# Patient Record
Sex: Male | Born: 1990 | Race: Black or African American | Hispanic: No | Marital: Single | Smoking: Former smoker
Health system: Southern US, Community
[De-identification: ages and names within clinical notes are randomized; demographics above are authoritative.]

## PROBLEM LIST (undated history)

## (undated) DIAGNOSIS — N4400 Torsion of testis, unspecified: Secondary | ICD-10-CM

## (undated) DIAGNOSIS — F319 Bipolar disorder, unspecified: Secondary | ICD-10-CM

## (undated) DIAGNOSIS — F411 Generalized anxiety disorder: Secondary | ICD-10-CM

## (undated) HISTORY — DX: Generalized anxiety disorder: F41.1

## (undated) HISTORY — DX: Bipolar disorder, unspecified: F31.9

---

## 2009-03-03 ENCOUNTER — Emergency Department: Payer: Self-pay | Admitting: Emergency Medicine

## 2010-01-22 ENCOUNTER — Ambulatory Visit: Payer: Self-pay | Admitting: Internal Medicine

## 2010-08-22 ENCOUNTER — Emergency Department: Payer: Self-pay | Admitting: Emergency Medicine

## 2012-05-09 IMAGING — US US PELVIS LIMITED
1 series · 17 of 25 positions shown · non-contrast
Comparison: none

REASON FOR EXAM: LEFT TESTICLE PAIN; H/O EPIDIDYMITIS
COMMENTS:   May transport without cardiac monitor

PROCEDURE:     US  - US TESTICULAR  - August 23, 2010  [DATE]
RESULT:

[Series 1: us pelvis limited · 50 acquisitions, 17 frames shown]
[im 1/50]
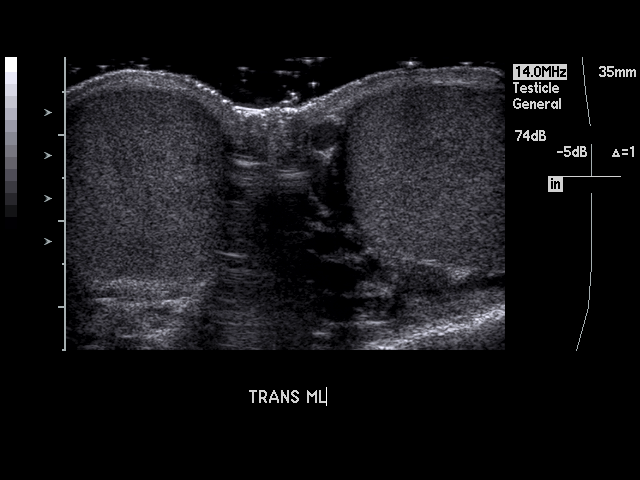
[im 5/50]
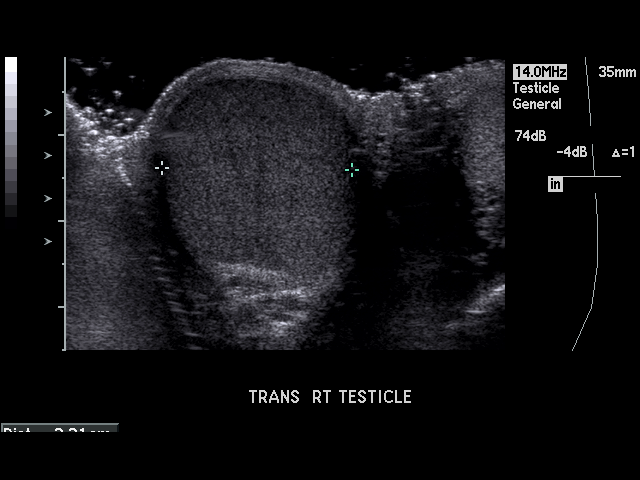
[im 7/50]
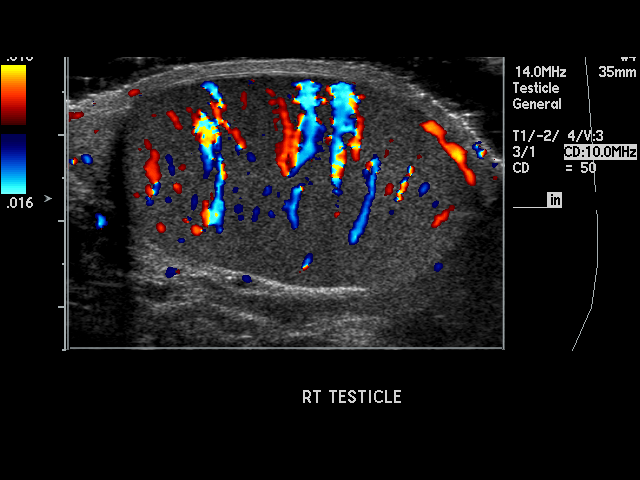
[im 11/50]
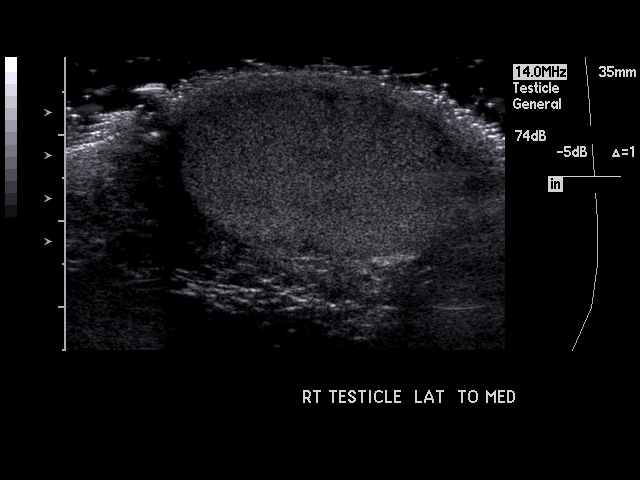
[im 13/50]
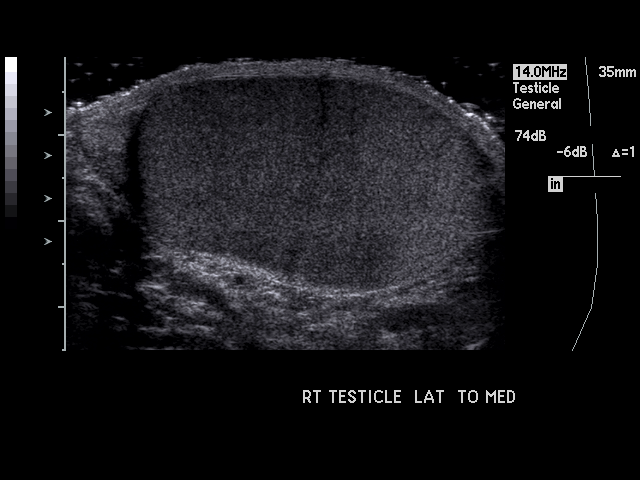
[im 17/50]
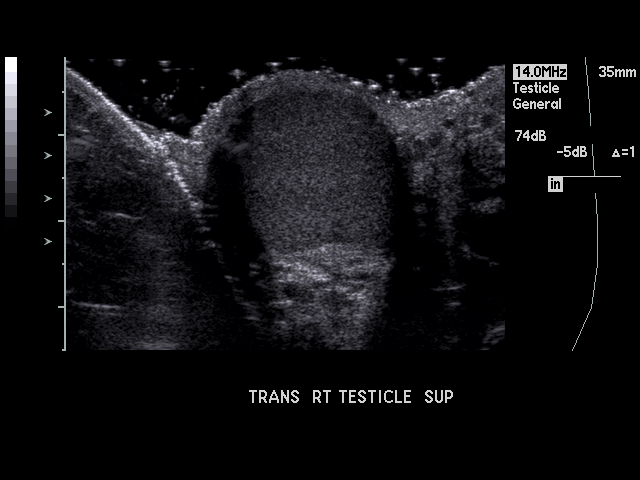
[im 19/50]
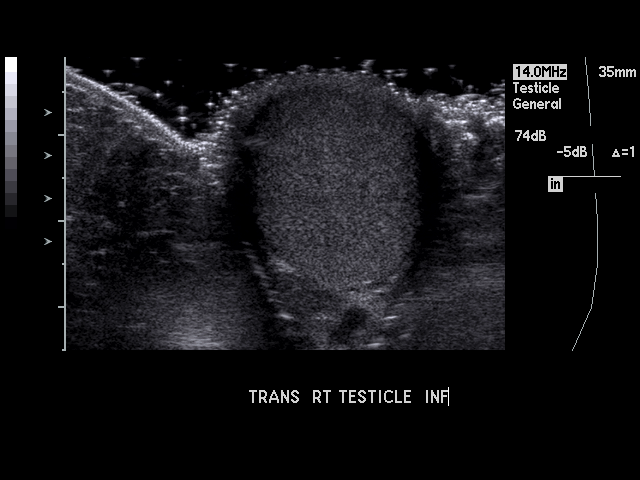
[im 23/50]
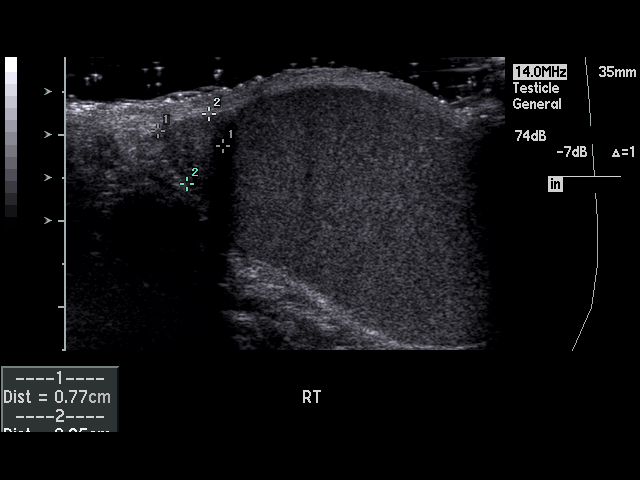
[im 25/50]
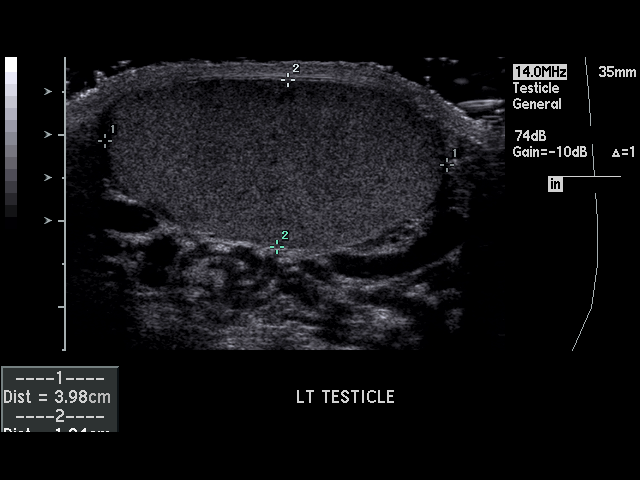
[im 27/50]
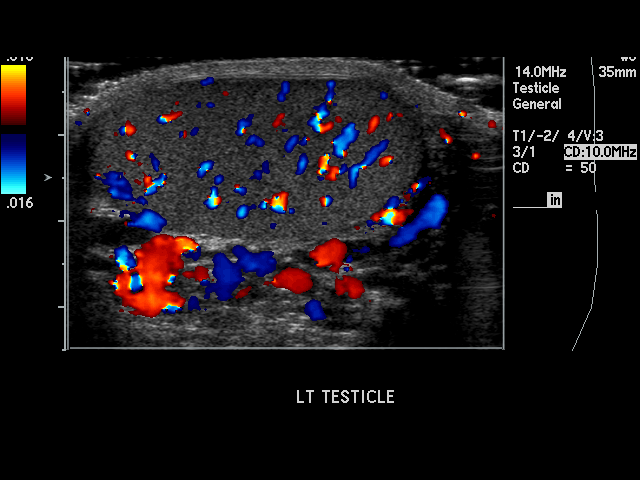
[im 31/50]
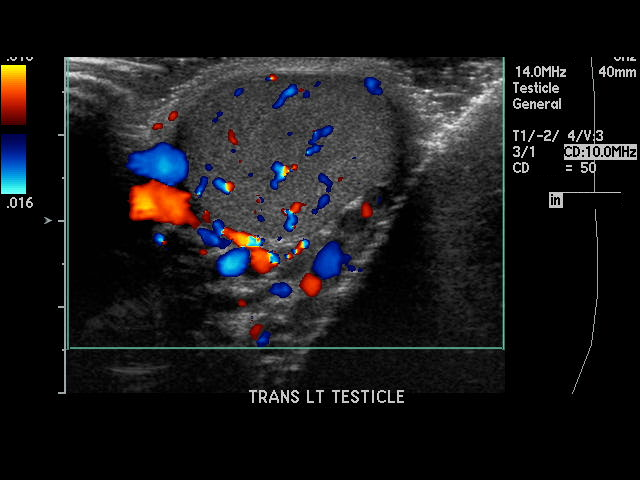
[im 33/50]
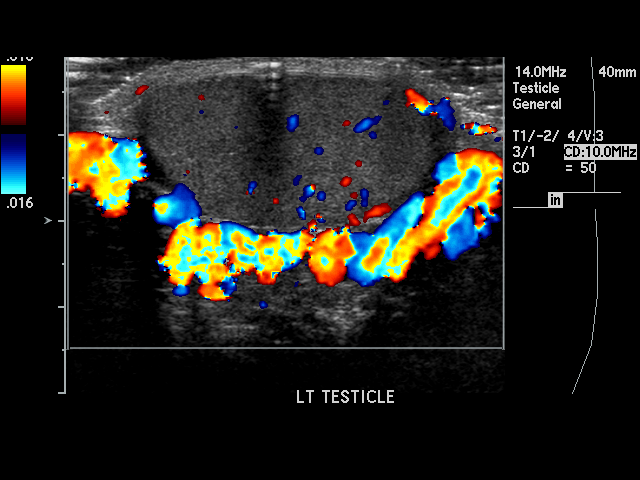
[im 37/50]
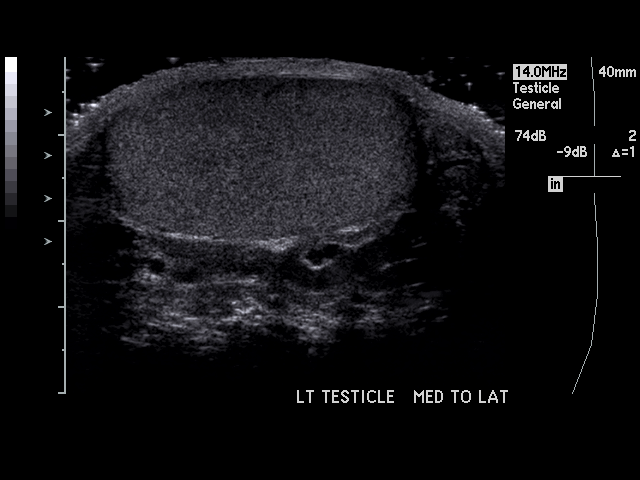
[im 39/50]
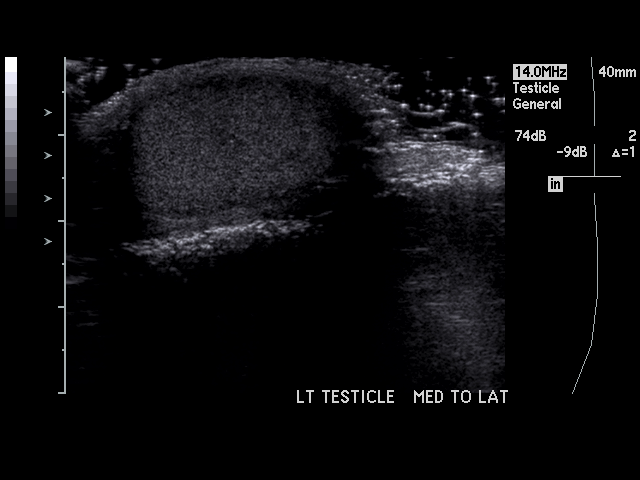
[im 43/50]
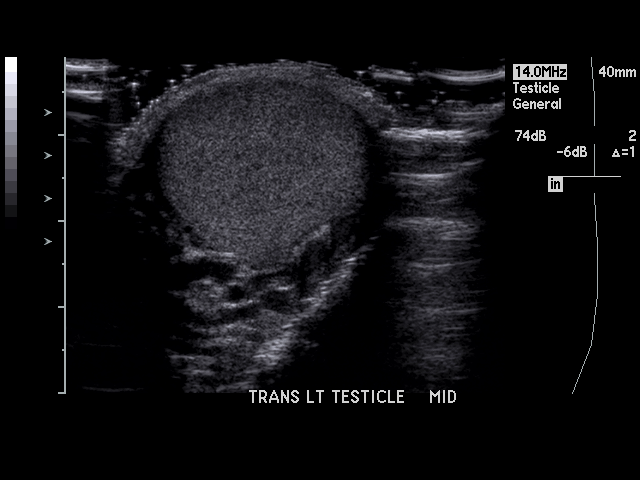
[im 45/50]
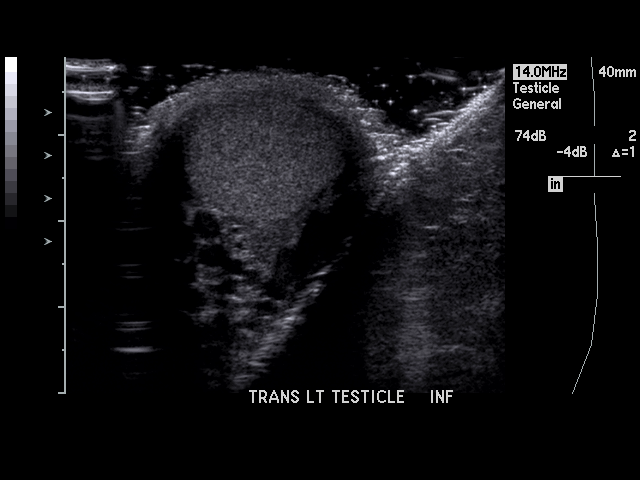
[im 50/50]
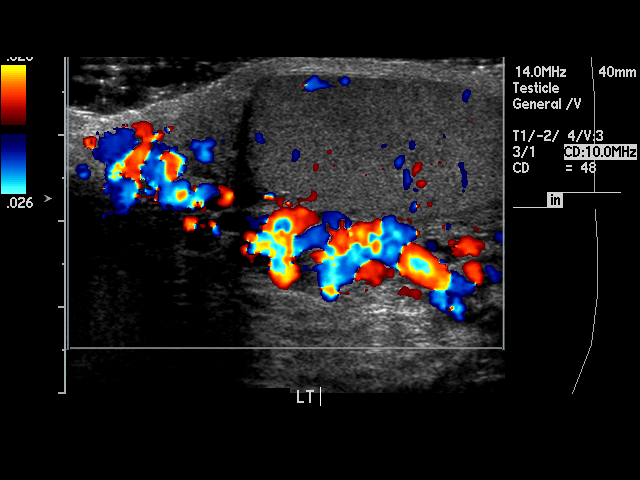

[17 of 25 positions shown; findings below may reference images not displayed]

FINDINGS: The right testicle measures 4.2 x 2.21 x 2.29 cm and the left
x 2.65 x 1.94 cm. The testicular architecture demonstrates a homogeneous
gray felt appearance. Symmetric arterial venous flow is identified within
the right and left testicles. The epididymides are symmetric with symmetric
homogeneous echotexture and symmetric flow. The right measures 0.77 x 1.38 x
0.85 cm and the left 0.72 x 0.85 x 0.94 cm. A moderate to large varicocele
is appreciated within the left hemiscrotum.

No further sonographic abnormalities are identified.
IMPRESSION: 1.  Stable Scrotal Ultrasound when compared to previous study dated 01/22/2010.
[DATE].  Findings again consistent with a varicocele.
3.  Dr. Bench of the Emergency Department was informed of these findings via
a preliminary faxed report.

## 2014-01-25 ENCOUNTER — Ambulatory Visit (INDEPENDENT_AMBULATORY_CARE_PROVIDER_SITE_OTHER): Payer: 59 | Admitting: Licensed Clinical Social Worker

## 2014-01-25 DIAGNOSIS — F4323 Adjustment disorder with mixed anxiety and depressed mood: Secondary | ICD-10-CM

## 2014-02-01 ENCOUNTER — Ambulatory Visit (INDEPENDENT_AMBULATORY_CARE_PROVIDER_SITE_OTHER): Payer: 59 | Admitting: Licensed Clinical Social Worker

## 2014-02-01 DIAGNOSIS — F4323 Adjustment disorder with mixed anxiety and depressed mood: Secondary | ICD-10-CM

## 2014-02-11 ENCOUNTER — Ambulatory Visit (INDEPENDENT_AMBULATORY_CARE_PROVIDER_SITE_OTHER): Payer: 59 | Admitting: Licensed Clinical Social Worker

## 2014-02-11 DIAGNOSIS — F4323 Adjustment disorder with mixed anxiety and depressed mood: Secondary | ICD-10-CM

## 2014-02-15 ENCOUNTER — Ambulatory Visit (INDEPENDENT_AMBULATORY_CARE_PROVIDER_SITE_OTHER): Payer: 59 | Admitting: Licensed Clinical Social Worker

## 2014-02-15 DIAGNOSIS — F331 Major depressive disorder, recurrent, moderate: Secondary | ICD-10-CM

## 2014-02-15 DIAGNOSIS — F411 Generalized anxiety disorder: Secondary | ICD-10-CM

## 2014-02-25 ENCOUNTER — Ambulatory Visit (INDEPENDENT_AMBULATORY_CARE_PROVIDER_SITE_OTHER): Payer: 59 | Admitting: Licensed Clinical Social Worker

## 2014-02-25 DIAGNOSIS — F332 Major depressive disorder, recurrent severe without psychotic features: Secondary | ICD-10-CM

## 2014-02-25 DIAGNOSIS — F411 Generalized anxiety disorder: Secondary | ICD-10-CM

## 2014-03-04 ENCOUNTER — Ambulatory Visit (INDEPENDENT_AMBULATORY_CARE_PROVIDER_SITE_OTHER): Payer: 59 | Admitting: Licensed Clinical Social Worker

## 2014-03-04 DIAGNOSIS — F411 Generalized anxiety disorder: Secondary | ICD-10-CM

## 2014-03-04 DIAGNOSIS — F332 Major depressive disorder, recurrent severe without psychotic features: Secondary | ICD-10-CM

## 2014-03-15 ENCOUNTER — Ambulatory Visit (INDEPENDENT_AMBULATORY_CARE_PROVIDER_SITE_OTHER): Payer: 59 | Admitting: Licensed Clinical Social Worker

## 2014-03-15 DIAGNOSIS — F332 Major depressive disorder, recurrent severe without psychotic features: Secondary | ICD-10-CM

## 2014-03-15 DIAGNOSIS — F411 Generalized anxiety disorder: Secondary | ICD-10-CM

## 2014-03-25 ENCOUNTER — Ambulatory Visit (INDEPENDENT_AMBULATORY_CARE_PROVIDER_SITE_OTHER): Payer: 59 | Admitting: Licensed Clinical Social Worker

## 2014-03-25 DIAGNOSIS — F411 Generalized anxiety disorder: Secondary | ICD-10-CM

## 2014-03-25 DIAGNOSIS — F332 Major depressive disorder, recurrent severe without psychotic features: Secondary | ICD-10-CM

## 2014-04-01 ENCOUNTER — Ambulatory Visit (INDEPENDENT_AMBULATORY_CARE_PROVIDER_SITE_OTHER): Payer: 59 | Admitting: Licensed Clinical Social Worker

## 2014-04-01 DIAGNOSIS — F332 Major depressive disorder, recurrent severe without psychotic features: Secondary | ICD-10-CM

## 2014-04-01 DIAGNOSIS — F411 Generalized anxiety disorder: Secondary | ICD-10-CM

## 2014-04-08 ENCOUNTER — Ambulatory Visit (INDEPENDENT_AMBULATORY_CARE_PROVIDER_SITE_OTHER): Payer: 59 | Admitting: Licensed Clinical Social Worker

## 2014-04-08 DIAGNOSIS — F332 Major depressive disorder, recurrent severe without psychotic features: Secondary | ICD-10-CM

## 2014-04-08 DIAGNOSIS — F411 Generalized anxiety disorder: Secondary | ICD-10-CM

## 2014-04-15 ENCOUNTER — Ambulatory Visit (INDEPENDENT_AMBULATORY_CARE_PROVIDER_SITE_OTHER): Payer: 59 | Admitting: Licensed Clinical Social Worker

## 2014-04-15 DIAGNOSIS — F411 Generalized anxiety disorder: Secondary | ICD-10-CM

## 2014-04-15 DIAGNOSIS — F332 Major depressive disorder, recurrent severe without psychotic features: Secondary | ICD-10-CM

## 2014-04-20 ENCOUNTER — Emergency Department: Payer: Self-pay | Admitting: Emergency Medicine

## 2014-04-20 LAB — URINALYSIS, COMPLETE
BILIRUBIN, UR: NEGATIVE
BLOOD: NEGATIVE
Bacteria: NONE SEEN
GLUCOSE, UR: NEGATIVE mg/dL (ref 0–75)
Ketone: NEGATIVE
LEUKOCYTE ESTERASE: NEGATIVE
Nitrite: NEGATIVE
Ph: 6 (ref 4.5–8.0)
Protein: NEGATIVE
SPECIFIC GRAVITY: 1.02 (ref 1.003–1.030)
WBC UR: <1 /HPF (ref 0–5)

## 2014-04-29 ENCOUNTER — Ambulatory Visit: Payer: 59 | Admitting: Licensed Clinical Social Worker

## 2014-05-24 HISTORY — PX: SURGERY SCROTAL / TESTICULAR: SUR1316

## 2014-05-27 ENCOUNTER — Ambulatory Visit: Payer: Self-pay | Admitting: Urology

## 2014-05-29 ENCOUNTER — Emergency Department: Payer: Self-pay | Admitting: Emergency Medicine

## 2014-05-29 LAB — BASIC METABOLIC PANEL
Anion Gap: 7 (ref 7–16)
BUN: 10 mg/dL (ref 7–18)
CHLORIDE: 106 mmol/L (ref 98–107)
Calcium, Total: 8.9 mg/dL (ref 8.5–10.1)
Co2: 28 mmol/L (ref 21–32)
Creatinine: 0.97 mg/dL (ref 0.60–1.30)
EGFR (African American): >60
EGFR (Non-African Amer.): >60
Glucose: 90 mg/dL (ref 65–99)
Osmolality: 280 (ref 275–301)
POTASSIUM: 3.9 mmol/L (ref 3.5–5.1)
SODIUM: 141 mmol/L (ref 136–145)

## 2014-05-29 LAB — URINALYSIS, COMPLETE
BILIRUBIN, UR: NEGATIVE
Blood: NEGATIVE
GLUCOSE, UR: NEGATIVE mg/dL (ref 0–75)
Ketone: NEGATIVE
LEUKOCYTE ESTERASE: NEGATIVE
NITRITE: NEGATIVE
Ph: 7 (ref 4.5–8.0)
Protein: NEGATIVE
RBC,UR: NONE SEEN /HPF (ref 0–5)
SQUAMOUS EPITHELIAL: NONE SEEN
Specific Gravity: 1.012 (ref 1.003–1.030)
WBC UR: NONE SEEN /HPF (ref 0–5)

## 2014-05-29 LAB — CBC
HCT: 46.5 % (ref 40.0–52.0)
HGB: 14.7 g/dL (ref 13.0–18.0)
MCH: 27.8 pg (ref 26.0–34.0)
MCHC: 31.5 g/dL — ABNORMAL LOW (ref 32.0–36.0)
MCV: 88 fL (ref 80–100)
Platelet: 262 10*3/uL (ref 150–440)
RBC: 5.28 10*6/uL (ref 4.40–5.90)
RDW: 13.5 % (ref 11.5–14.5)
WBC: 10.6 10*3/uL (ref 3.8–10.6)

## 2014-09-22 NOTE — Op Note (Signed)
PATIENT NAME:  Eric Townsend, Eric Townsend MR#:  [id number] DATE OF BIRTH:  03/26/1991  DATE OF PROCEDURE:  05/27/2014  PREOPERATIVE DIAGNOSES:   1.  Intermittent testicular torsion.  2.  Left varicocele.   POSTOPERATIVE DIAGNOSES:  1.  Intermittent testicular torsion.  2.  Left varicocele.    PROCEDURE:  Left varicocelectomy and bilateral orchiopexy for intermittent torsion.   SURGEON: Dayrin Stallone D. Edwyna ShellHart, DO    ANESTHESIA: General.   ESTIMATED BLOOD LOSS: None.   DESCRIPTION OF PROCEDURE: With the patient in supine position, sterile prep and drape and an appropriate timeout witnessed by all factions, we begin the procedure.  An inguinal incision is made using the anterior-superior iliac spine and the pubic tubercles.  The incision is made between them. I find the cord to be very medial, more medial than usual. This may be the reason he has such varicocele. He has a very straight and not angled cord approach toward the renal vein.  The spermatic veins are isolated, just between the internal and external ring, elevated and brought into the field. The veins are isolated bilaterally and each end is tied off.  The vas is identified. No veins are left intact. The vas is left intact. The artery was left intact. Then I closed the incision with a running 3-0 Vicryl, with a 4-0 Vicryl in the skin and subcuticular.  Dermabond is placed over the incision. The scrotal incision is then done. in the midline. Each testicle is isolated and a suture is placed laterally, medially and inferiorly.  So, I used 4-0 PDS for this.  It is repeated on both sides. Then the tunics are closed utilizing a running 3-0 Vicryl and the skin is closed with this continuous suture.  Dermabond is placed over the incision. He is sent to recovery in satisfactory condition.      ____________________________ Caralyn Guileichard D. Edwyna ShellHart, DO rdh:DT D: 05/27/2014 14:26:59 ET Townsend: 05/27/2014 15:47:14 ET JOB#: [id number]  cc: Caralyn Guileichard D. Edwyna ShellHart, DO,  <Dictator> Lithzy Bernard D Hayden Kihara DO ELECTRONICALLY SIGNED 05/31/2014 14:45

## 2014-11-06 ENCOUNTER — Emergency Department (HOSPITAL_COMMUNITY)
Admission: EM | Admit: 2014-11-06 | Discharge: 2014-11-06 | Disposition: A | Discharge status: Home / Self Care | Payer: 59 | Attending: Emergency Medicine | Admitting: Emergency Medicine

## 2014-11-06 ENCOUNTER — Encounter (HOSPITAL_COMMUNITY): Payer: Self-pay | Admitting: *Deleted

## 2014-11-06 ENCOUNTER — Emergency Department (HOSPITAL_COMMUNITY): Payer: 59

## 2014-11-06 DIAGNOSIS — S59902A Unspecified injury of left elbow, initial encounter: Secondary | ICD-10-CM | POA: Diagnosis present

## 2014-11-06 DIAGNOSIS — Y9389 Activity, other specified: Secondary | ICD-10-CM | POA: Insufficient documentation

## 2014-11-06 DIAGNOSIS — Y998 Other external cause status: Secondary | ICD-10-CM | POA: Diagnosis not present

## 2014-11-06 DIAGNOSIS — S4992XA Unspecified injury of left shoulder and upper arm, initial encounter: Secondary | ICD-10-CM | POA: Insufficient documentation

## 2014-11-06 DIAGNOSIS — Y92410 Unspecified street and highway as the place of occurrence of the external cause: Secondary | ICD-10-CM | POA: Insufficient documentation

## 2014-11-06 DIAGNOSIS — S66812A Strain of other specified muscles, fascia and tendons at wrist and hand level, left hand, initial encounter: Secondary | ICD-10-CM | POA: Diagnosis not present

## 2014-11-06 DIAGNOSIS — Z87438 Personal history of other diseases of male genital organs: Secondary | ICD-10-CM | POA: Insufficient documentation

## 2014-11-06 DIAGNOSIS — S46912A Strain of unspecified muscle, fascia and tendon at shoulder and upper arm level, left arm, initial encounter: Secondary | ICD-10-CM

## 2014-11-06 HISTORY — DX: Torsion of testis, unspecified: N44.00

## 2014-11-06 MED ORDER — IBUPROFEN 800 MG PO TABS
800.0000 mg | ORAL_TABLET | Freq: Once | ORAL | Status: AC
  Administered 2014-11-06: 800 mg via ORAL
  Filled 2014-11-06: qty 1

## 2014-11-06 MED ORDER — IBUPROFEN 800 MG PO TABS: 800.0000 mg | ORAL_TABLET | Freq: Three times a day (TID) | ORAL | Status: DC

## 2014-11-06 NOTE — Discharge Instructions (Signed)
Cryotherapy °Cryotherapy means treatment with cold. Ice or gel packs can be used to reduce both pain and swelling. Ice is the most helpful within the first 24 to 48 hours after an injury or flare-up from overusing a muscle or joint. Sprains, strains, spasms, burning pain, shooting pain, and aches can all be eased with ice. Ice can also be used when recovering from surgery. Ice is effective, has very few side effects, and is safe for most people to use. °PRECAUTIONS  °Ice is not a safe treatment option for people with: °· Raynaud phenomenon. This is a condition affecting small blood vessels in the extremities. Exposure to cold may cause your problems to return. °· Cold hypersensitivity. There are many forms of cold hypersensitivity, including: °¨ Cold urticaria. Red, itchy hives appear on the skin when the tissues begin to warm after being iced. °¨ Cold erythema. This is a red, itchy rash caused by exposure to cold. °¨ Cold hemoglobinuria. Red blood cells break down when the tissues begin to warm after being iced. The hemoglobin that carry oxygen are passed into the urine because they cannot combine with blood proteins fast enough. °· Numbness or altered sensitivity in the area being iced. °If you have any of the following conditions, do not use ice until you have discussed cryotherapy with your caregiver: °· Heart conditions, such as arrhythmia, angina, or chronic heart disease. °· High blood pressure. °· Healing wounds or open skin in the area being iced. °· Current infections. °· Rheumatoid arthritis. °· Poor circulation. °· Diabetes. °Ice slows the blood flow in the region it is applied. This is beneficial when trying to stop inflamed tissues from spreading irritating chemicals to surrounding tissues. However, if you expose your skin to cold temperatures for too long or without the proper protection, you can damage your skin or nerves. Watch for signs of skin damage due to cold. °HOME CARE INSTRUCTIONS °Follow  these tips to use ice and cold packs safely. °· Place a dry or damp towel between the ice and skin. A damp towel will cool the skin more quickly, so you may need to shorten the time that the ice is used. °· For a more rapid response, add gentle compression to the ice. °· Ice for no more than 10 to 20 minutes at a time. The bonier the area you are icing, the less time it will take to get the benefits of ice. °· Check your skin after 5 minutes to make sure there are no signs of a poor response to cold or skin damage. °· Rest 20 minutes or more between uses. °· Once your skin is numb, you can end your treatment. You can test numbness by very lightly touching your skin. The touch should be so light that you do not see the skin dimple from the pressure of your fingertip. When using ice, most people will feel these normal sensations in this order: cold, burning, aching, and numbness. °· Do not use ice on someone who cannot communicate their responses to pain, such as small children or people with dementia. °HOW TO MAKE AN ICE PACK °Ice packs are the most common way to use ice therapy. Other methods include ice massage, ice baths, and cryosprays. Muscle creams that cause a cold, tingly feeling do not offer the same benefits that ice offers and should not be used as a substitute unless recommended by your caregiver. °To make an ice pack, do one of the following: °· Place crushed ice or a   bag of frozen vegetables in a sealable plastic bag. Squeeze out the excess air. Place this bag inside another plastic bag. Slide the bag into a pillowcase or place a damp towel between your skin and the bag. °· Mix 3 parts water with 1 part rubbing alcohol. Freeze the mixture in a sealable plastic bag. When you remove the mixture from the freezer, it will be slushy. Squeeze out the excess air. Place this bag inside another plastic bag. Slide the bag into a pillowcase or place a damp towel between your skin and the bag. °SEEK MEDICAL CARE  IF: °· You develop white spots on your skin. This may give the skin a blotchy (mottled) appearance. °· Your skin turns blue or pale. °· Your skin becomes waxy or hard. °· Your swelling gets worse. °MAKE SURE YOU:  °· Understand these instructions. °· Will watch your condition. °· Will get help right away if you are not doing well or get worse. °Document Released: 01/04/2011 Document Revised: 09/24/2013 Document Reviewed: 01/04/2011 °ExitCare® Patient Information ©2015 [employer]. This information is not intended to replace advice given to you by your health care provider. Make sure you discuss any questions you have with your health care provider. °Motor Vehicle Collision °It is common to have multiple bruises and sore muscles after a motor vehicle collision (MVC). These tend to feel worse for the first 24 hours. You may have the most stiffness and soreness over the first several hours. You may also feel worse when you wake up the first morning after your collision. After this point, you will usually begin to improve with each day. The speed of improvement often depends on the severity of the collision, the number of injuries, and the location and nature of these injuries. °HOME CARE INSTRUCTIONS °· Put ice on the injured area. °¨ Put ice in a plastic bag. °¨ Place a towel between your skin and the bag. °¨ Leave the ice on for 15-20 minutes, 3-4 times a day, or as directed by your health care provider. °· Drink enough fluids to keep your urine clear or pale yellow. Do not drink alcohol. °· Take a warm shower or bath once or twice a day. This will increase blood flow to sore muscles. °· You may return to activities as directed by your caregiver. Be careful when lifting, as this may aggravate neck or back pain. °· Only take over-the-counter or prescription medicines for pain, discomfort, or fever as directed by your caregiver. Do not use aspirin. This may increase bruising and bleeding. °SEEK IMMEDIATE MEDICAL CARE  IF: °· You have numbness, tingling, or weakness in the arms or legs. °· You develop severe headaches not relieved with medicine. °· You have severe neck pain, especially tenderness in the middle of the back of your neck. °· You have changes in bowel or bladder control. °· There is increasing pain in any area of the body. °· You have shortness of breath, light-headedness, dizziness, or fainting. °· You have chest pain. °· You feel sick to your stomach (nauseous), throw up (vomit), or sweat. °· You have increasing abdominal discomfort. °· There is blood in your urine, stool, or vomit. °· You have pain in your shoulder (shoulder strap areas). °· You feel your symptoms are getting worse. °MAKE SURE YOU: °· Understand these instructions. °· Will watch your condition. °· Will get help right away if you are not doing well or get worse. °Document Released: 05/10/2005 Document Revised: 09/24/2013 Document Reviewed: 10/07/2010 °ExitCare® Patient   Information ©2015 [employer]. This information is not intended to replace advice given to you by your health care provider. Make sure you discuss any questions you have with your health care provider. ° °
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
## 2014-11-06 NOTE — ED Notes (Signed)
Patient transported to X-ray 

## 2016-01-05 IMAGING — US US SCROTUM W/ DOPPLER COMPLETE
1 series · 14 of 25 positions shown · non-contrast
Comparison: None.

CLINICAL DATA: Initial evaluation for right testicular pain since 8
a.m. this morning

EXAM:
SCROTAL ULTRASOUND
DOPPLER ULTRASOUND OF THE TESTICLES
TECHNIQUE: Complete ultrasound examination of the testicles, epididymis, and
other scrotal structures was performed. Color and spectral Doppler
ultrasound were also utilized to evaluate blood flow to the
testicles.

[Series 1: us scrotum w/ doppler complete · 0.04mm/px · 14 of 73 slices shown]
[im 1/73]
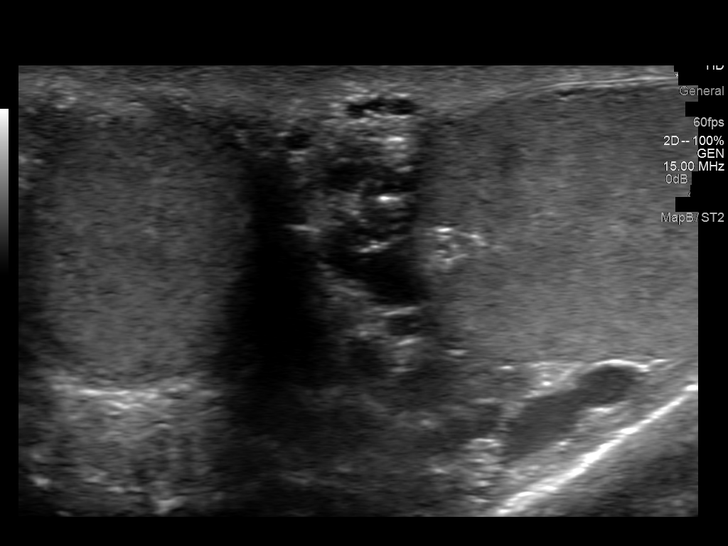
[im 7/73]
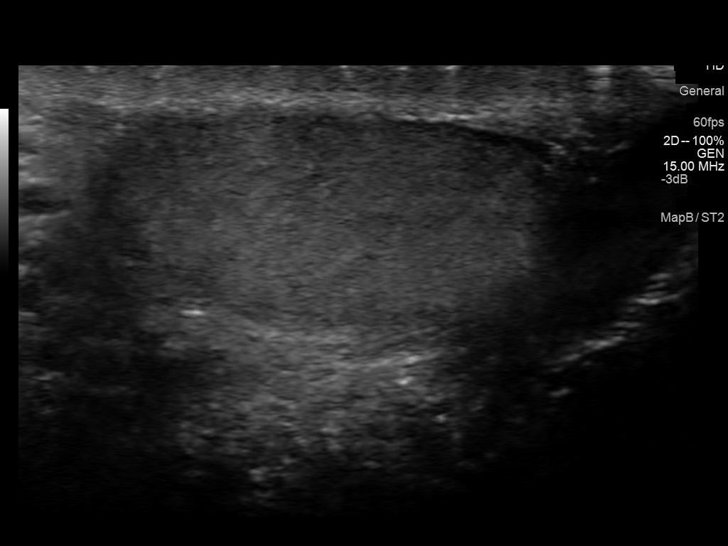
[im 13/73]
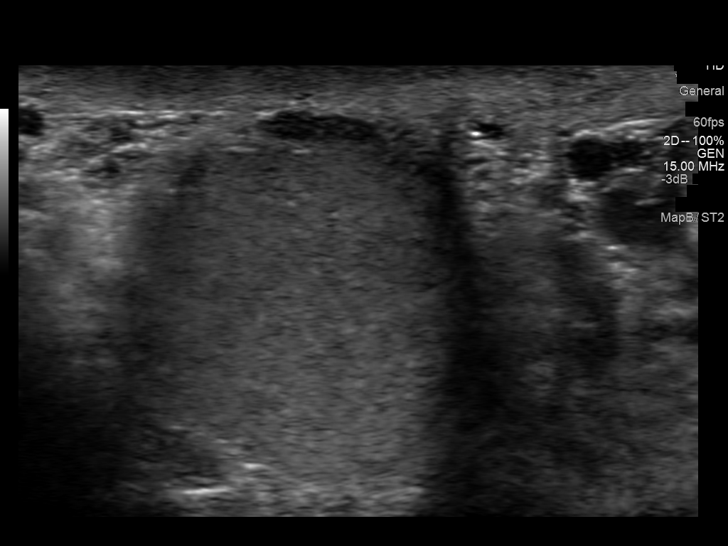
[im 19/73]
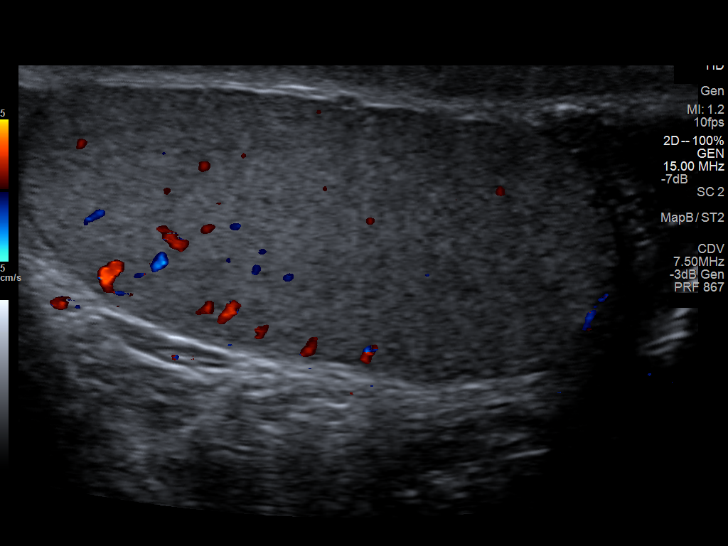
[im 25/73]
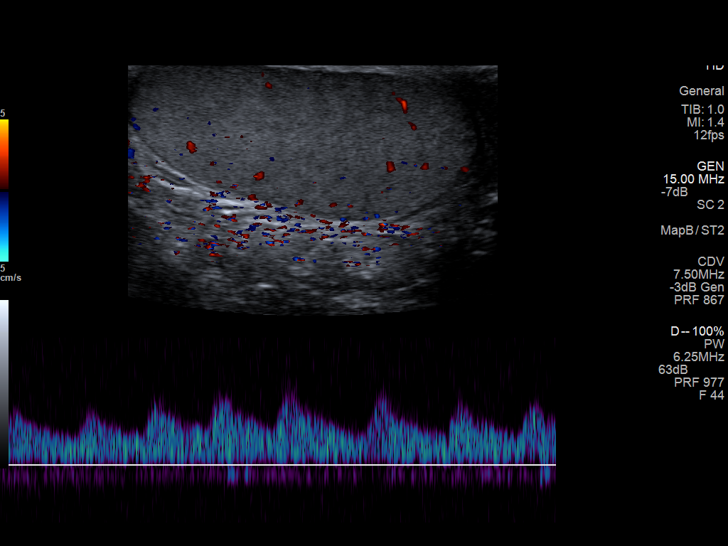
[im 28/73]
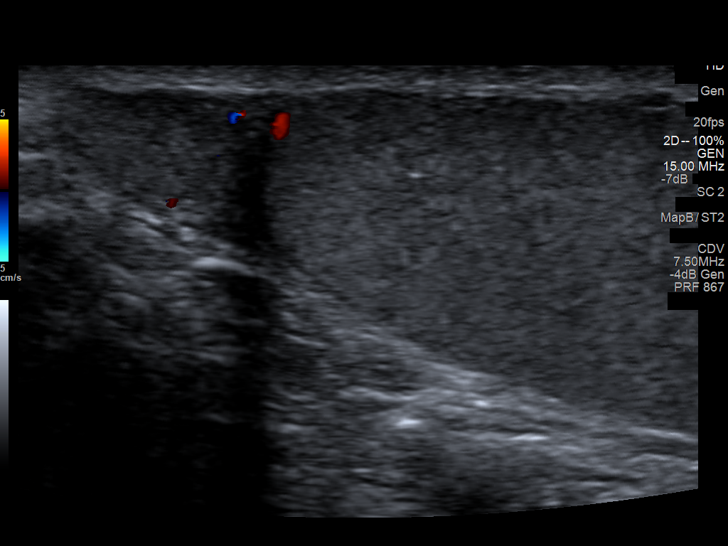
[im 34/73]
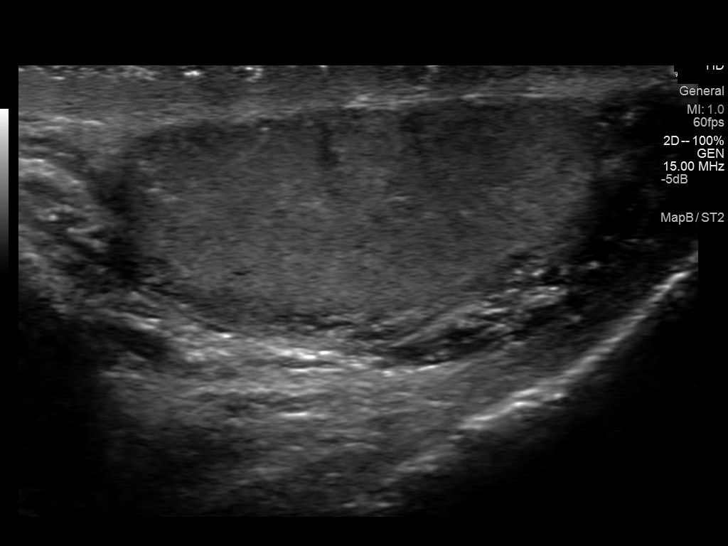
[im 40/73]
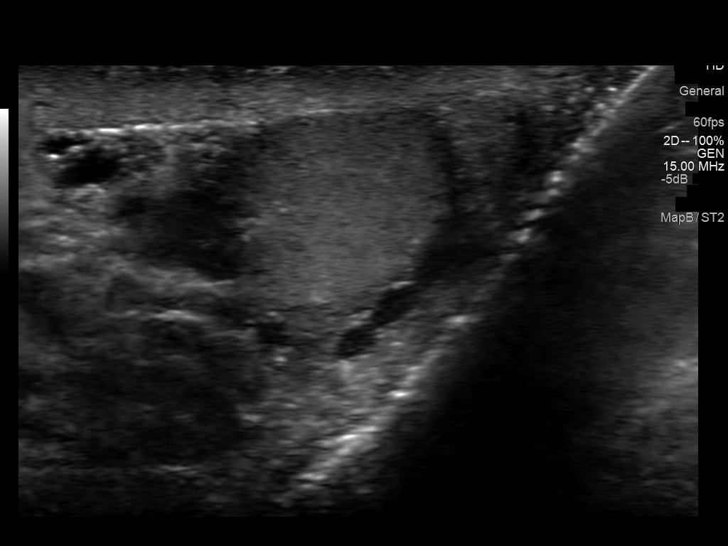
[im 46/73]
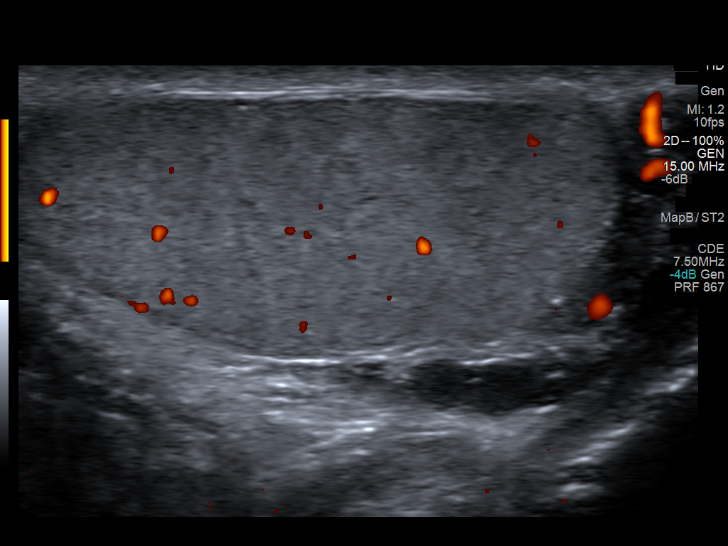
[im 49/73]
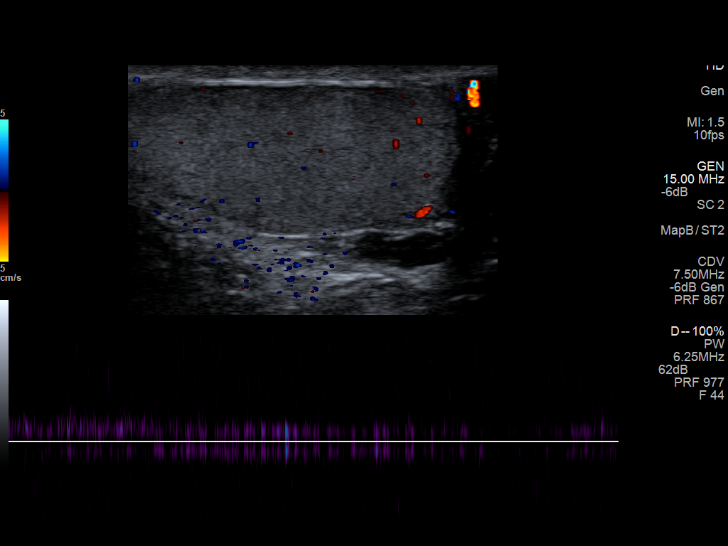
[im 55/73]
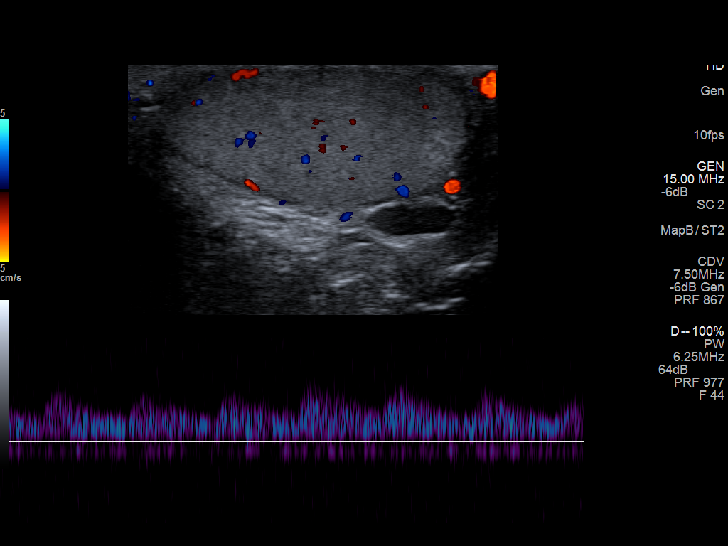
[im 61/73]
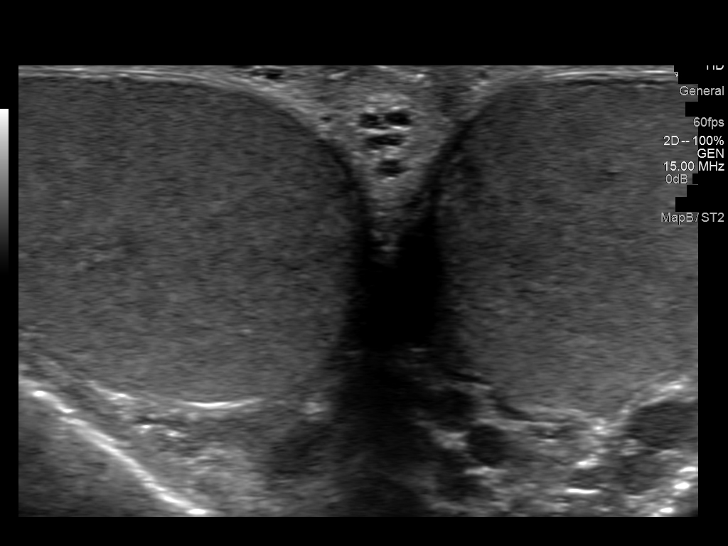
[im 67/73]
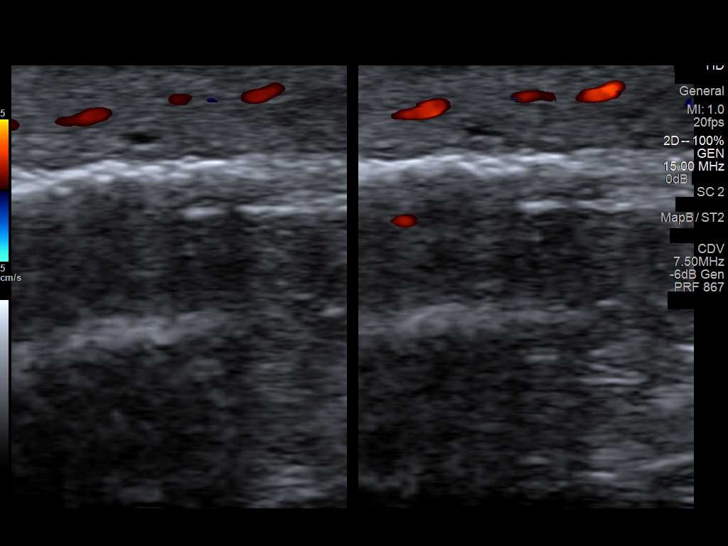
[im 73/73]
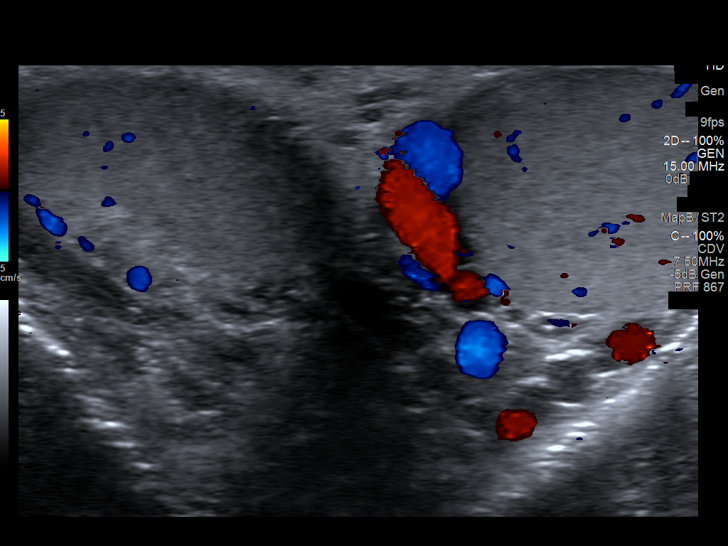

[14 of 25 positions shown; findings below may reference images not displayed]

FINDINGS: Right testicle

Measurements: 43 x 19 x 25 mm. No mass or microlithiasis visualized.

Left testicle

Measurements: 40 x 16 x 25 mm. No mass or microlithiasis visualized.

Right epididymis:  Normal in size and appearance.

Left epididymis:  Normal in size and appearance.

Hydrocele:  None visualized.

Varicocele: For is a prominent left-sided varicocele with venous
channels measuring up to slightly greater than 4 mm

Pulsed Doppler interrogation of both testes demonstrates low
resistance arterial and venous waveforms bilaterally.
IMPRESSION: Left varicocele. The testicles are normal with no evidence of
torsion.

## 2016-07-30 ENCOUNTER — Encounter: Payer: Self-pay | Admitting: Internal Medicine

## 2016-07-30 ENCOUNTER — Ambulatory Visit (INDEPENDENT_AMBULATORY_CARE_PROVIDER_SITE_OTHER): Payer: Self-pay | Admitting: Internal Medicine

## 2016-07-30 VITALS — BP 120/70 | Ht 74.0 in | Wt 164.0 lb

## 2016-07-30 DIAGNOSIS — F411 Generalized anxiety disorder: Secondary | ICD-10-CM

## 2016-07-30 DIAGNOSIS — J029 Acute pharyngitis, unspecified: Secondary | ICD-10-CM

## 2016-07-30 LAB — POCT RAPID STREP A (OFFICE): RAPID STREP A SCREEN: NEGATIVE

## 2016-07-30 MED ORDER — AMOXICILLIN 500 MG PO CAPS: ORAL_CAPSULE | ORAL | 0 refills | Status: DC

## 2016-07-30 NOTE — Progress Notes (Signed)
   Subjective:    Patient ID: Eric Townsend, male    DOB: Nov 17, 1990, 26 y.o.   MRN: [id number]  HPI   Here to establish.  Sore throat starting about 4 days ago, with worsening in past 48 hours.  + Headache, a slight stomachache.  Has had diarrhea.  Has had sense of fever.   Taking 800 mg of Ibuprofen for sore throat and headache, which has helped.  Hurts to swallow his oral secretions and water.   No one around him with illness.  Current Meds  Medication Sig  . ibuprofen (ADVIL,MOTRIN) 800 MG tablet Take 1 tablet (800 mg total) by mouth 3 (three) times daily.   No Known Allergies   Past Medical History:  Diagnosis Date  . Generalized anxiety disorder    [facility]. Has been on mediction:  Zoloft 100 mg--doesn't like, Prozac 40 mg  . Testicular torsion     Past Surgical History:  Procedure Laterality Date  . SURGERY SCROTAL / TESTICULAR Bilateral 05/2014   Removed left varicocele and performed bilateral orchiopexy   Family History  Problem Relation Age of Onset  . Obesity Mother   . Kidney disease Father   . Diabetes Father   . Obesity Father   . Hyperlipidemia Father   . Depression Father     and Anxiety   Social History   Social History  . Marital status: Single    Spouse name: N/A  . Number of children: N/A  . Years of education: college grad sociology   Occupational History  . [employer]ed [employer].    Social History Main Topics  . Smoking status: Former Smoker    Packs/day: 0.25    Years: 9.00    Types: Cigarettes    Quit date: 07/16/2016  . Smokeless tobacco: Never Used  . Alcohol use Yes     Comment: Drinks Friday, Saturday, Sunday  . Drug use: Yes    Types: Marijuana  . Sexual activity: Not on file   Other Topics Concern  . Not on file   Social History Narrative  . No narrative on file     Review of Systems     Objective:   Physical Exam NAD Obvious throat pain when  swallowing HEENT:  PERRL, EOMI, TMs dull, tonsils swollen and red with soft palate petechia and thick green exudate, left tonsil Neck:  Supple, Mildly tender adenopathy Chest:  CTA CV:  RRR with normal S1 and S2, No S3, S4 or murmur, radial pulses normal and equal Abd:  S, NT, No HSM or mass, + BS.   Skin:  No rash       Assessment & Plan:  1.  Exudative Pharyngitis:  Rapid strep with streaking, but not definitive second line.  Will go ahead and treat for strep.  If no improvement over the weekend, will need further evaluation

## 2016-07-30 NOTE — Patient Instructions (Signed)
Call if no better at beginning of week Do not go to work with this illness until afebrile for 24 hours

## 2016-08-06 ENCOUNTER — Telehealth: Payer: Self-pay | Admitting: Licensed Clinical Social Worker

## 2016-08-06 NOTE — Telephone Encounter (Signed)
LCSW called pt to schedule counseling after referral from Dr Delrae AlfredMulberry. Left VM.

## 2016-08-16 ENCOUNTER — Telehealth: Payer: Self-pay | Admitting: Licensed Clinical Social Worker

## 2016-08-16 NOTE — Telephone Encounter (Signed)
LCSW left VM with pt re: scheduling counseling session. This was second attempt at contact since referral from Dr. Delrae AlfredMulberry.

## 2018-07-22 ENCOUNTER — Encounter (HOSPITAL_COMMUNITY): Payer: Self-pay | Admitting: Emergency Medicine

## 2018-07-22 ENCOUNTER — Emergency Department (HOSPITAL_COMMUNITY)
Admission: EM | Admit: 2018-07-22 | Discharge: 2018-07-22 | Disposition: A | Discharge status: Home / Self Care | Payer: 59 | Attending: Emergency Medicine | Admitting: Emergency Medicine

## 2018-07-22 ENCOUNTER — Other Ambulatory Visit: Payer: Self-pay

## 2018-07-22 DIAGNOSIS — Z87891 Personal history of nicotine dependence: Secondary | ICD-10-CM | POA: Insufficient documentation

## 2018-07-22 DIAGNOSIS — Z79899 Other long term (current) drug therapy: Secondary | ICD-10-CM | POA: Insufficient documentation

## 2018-07-22 DIAGNOSIS — I7300 Raynaud's syndrome without gangrene: Secondary | ICD-10-CM

## 2018-07-22 NOTE — ED Provider Notes (Signed)
[facility]-EMERGENCY DEPT Provider Note   CSN: [id number] Arrival date & time: 07/22/18  2058    History   Chief Complaint Chief Complaint  Patient presents with  . Hand Problem    HPI Eric Townsend is a 28 y.o. male.     HPI  28 year old male presents today stating that Eric Townsend has had some episodes of finger discoloration.  Occurred this evening and 2 fingers always work when Eric Townsend got very cold.  Resolved with warming.  Eric Townsend states that his ring finger and his little finger while office of hands got discolored tonight.  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  This is happened periodically in the first past but was worse tonight  Past Medical History:  Diagnosis Date  . Generalized anxiety disorder    Was seen at [facility] for this or [facility] student counseling services. Has been on mediction:  Zoloft 100 mg--doesn't like, Prozac 40 mg  . Testicular torsion     Patient Active Problem List   Diagnosis Date Noted  . Generalized anxiety disorder     Past Surgical History:  Procedure Laterality Date  . SURGERY SCROTAL / TESTICULAR Bilateral 05/2014   Removed left varicocele and performed bilateral orchiopexy        Home Medications    Prior to Admission medications   Medication Sig Start Date End Date Taking? Authorizing Provider  amoxicillin (AMOXIL) 500 MG capsule 1 cap by mouth twice daily for 10 days. 07/30/16   Julieanne Manson, MD  ibuprofen (ADVIL,MOTRIN) 800 MG tablet Take 1 tablet (800 mg total) by mouth 3 (three) times daily. 11/06/14   Elpidio Anis, PA-C    Family History Family History  Problem Relation Age of Onset  . Obesity Mother   . Kidney disease Father   . Diabetes Father   . Obesity Father   . Hyperlipidemia Father   . Depression Father        and Anxiety    Social History Social History   Tobacco Use  . Smoking status: Former Smoker    Packs/day: 0.25    Years: 9.00    Pack years: 2.25    Types:  Cigarettes    Last attempt to quit: 07/16/2016    Years since quitting: 2.0  . Smokeless tobacco: Never Used  Substance Use Topics  . Alcohol use: Yes    Comment: Drinks Friday, Saturday, Sunday  . Drug use: Yes    Types: Marijuana     Allergies   Patient has no known allergies.   Review of Systems Review of Systems  All other systems reviewed and are negative.    Physical Exam Updated Vital Signs BP 130/81 (BP Location: Left Arm)   Pulse 61   Temp 97.8 F (36.6 C) (Oral)   Resp 18   Ht 1.905 m (6\' 3" )   Wt 77.1 kg   SpO2 100%   BMI 21.25 kg/m   Physical Exam Vitals signs and nursing note reviewed.  Constitutional:      Appearance: Eric Townsend is normal weight.  HENT:     Head: Normocephalic.     Nose: Nose normal.  Neck:     Musculoskeletal: Normal range of motion.  Cardiovascular:     Rate and Rhythm: Normal rate and regular rhythm.  Pulmonary:     Effort: Pulmonary effort is normal.  Abdominal:     General: Abdomen is flat.  Musculoskeletal: Normal range of motion.  General: No swelling.  Skin:    Capillary Refill: Capillary refill takes less than 2 seconds.     Comments: All fingers are cool but there is no discoloration and no rashes  Neurological:     General: No focal deficit present.     Mental Status: Eric Townsend is alert.  Psychiatric:        Mood and Affect: Mood normal.      ED Treatments / Results  Labs (all labs ordered are listed, but only abnormal results are displayed) Labs Reviewed - No data to display  EKG None  Radiology No results found.  Procedures Procedures (including critical care time)  Medications Ordered in ED Medications - No data to display   Initial Impression / Assessment and Plan / ED Course  I have reviewed the triage vital signs and the nursing notes.  Pertinent labs & imaging results that were available during my care of the patient were reviewed by me and considered in my medical decision making (see chart  for details).          Final Clinical Impressions(s) / ED Diagnoses   Final diagnoses:  Raynaud's phenomenon without gangrene    ED Discharge Orders    None       Margarita Grizzle, MD 07/22/18 2128

## 2018-07-22 NOTE — Discharge Instructions (Signed)
Please call number on discharge paper to obtain primary care Keep your hands and core warm

## 2018-07-22 NOTE — ED Triage Notes (Signed)
Patient states that his ring finger on the left hand and pinky finger on right hand is feeling numb. Patient states that this has been going on for about a couple months of and on.

## 2018-08-08 ENCOUNTER — Encounter: Payer: Self-pay | Admitting: Internal Medicine

## 2018-08-08 ENCOUNTER — Ambulatory Visit: Payer: Self-pay | Admitting: Internal Medicine

## 2018-08-08 ENCOUNTER — Other Ambulatory Visit: Payer: Self-pay

## 2018-08-08 VITALS — BP 140/78 | HR 78 | Resp 12 | Ht 74.0 in | Wt 176.0 lb

## 2018-08-08 DIAGNOSIS — F3181 Bipolar II disorder: Secondary | ICD-10-CM | POA: Insufficient documentation

## 2018-08-08 DIAGNOSIS — Z87438 Personal history of other diseases of male genital organs: Secondary | ICD-10-CM | POA: Insufficient documentation

## 2018-08-08 DIAGNOSIS — I7300 Raynaud's syndrome without gangrene: Secondary | ICD-10-CM | POA: Insufficient documentation

## 2018-08-08 DIAGNOSIS — G8929 Other chronic pain: Secondary | ICD-10-CM

## 2018-08-08 DIAGNOSIS — M25512 Pain in left shoulder: Secondary | ICD-10-CM | POA: Insufficient documentation

## 2018-08-08 NOTE — Patient Instructions (Signed)
[facility]:  [phone]  

## 2018-08-08 NOTE — Progress Notes (Signed)
Subjective:    Patient ID: Eric Townsend, male   DOB: 03/26/1991, 28 y.o.   MRN: [id number]   HPI   Not seen her in 2 years.  1.  Bipolar II Disorder:  Has had both depressive and long term manic episodes. Divalproex 500 mg ER 500 mg at bedtime.  Trazodone 100 mg at bedtime. Struggles with staying on meds. States he can remember having symptoms when a freshman in college.  Feels the structure of high school may have kept him from exhibiting symptoms before that, but not clear had any symptoms. Formally diagnosed at [facility] end of 2018 with bipolar disorder.   Feels he is doing well on current medication.  2.  Diagnosed with Raynaud's syndrome with finger changes 07/22/2018.  He started noting numbness in fingers when going in and out of walk in refrigerator/freezer at [employer] where he works. Would look at fingers and note they were white to the base of his fingers.   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 He states he has had some bluish discoloration of distal fingers --that was mainly the day he went to ED.  He did get a deep red with color return.   States his fingers were normalizing when he was in ED He states this was happening once weekly especially when handling cold items.  He has not had a problem as he does not handle by hand cold items.   No rashes, joint complaints including redness or swelling, no problems swallowing, dry eyes, mouth.  3.  Left shoulder pain and popping  History of injury with moving company in 2018. Was moving a very heavy piece of furniture, felt his shoulder move funny and has had problems every since.   4.  History of testicular torsion and surgery on right in past.  Occasionally notes a lump on back of testicle.  Sometimes it hurts.  Has noted this since 2017.  Surgery was at 2016. History of Varicocele excision on left with same surgery.    Current Meds  Medication Sig  . divalproex  (DEPAKOTE ER) 500 MG 24 hr tablet Take by mouth daily.  Marland Kitchen ibuprofen (ADVIL,MOTRIN) 800 MG tablet Take 1 tablet (800 mg total) by mouth 3 (three) times daily.  . traZODone (DESYREL) 100 MG tablet Take 100 mg by mouth at bedtime.   No Known Allergies  Past Medical History:  Diagnosis Date  . Generalized anxiety disorder    Was seen at [facility] for this or [facility] student counseling services. Has been on mediction:  Zoloft 100 mg--doesn't like, Prozac 40 mg  . Testicular torsion     Past Surgical History:  Procedure Laterality Date  . SURGERY SCROTAL / TESTICULAR Bilateral 05/2014   Removed left varicocele and performed bilateral orchiopexy   Family History  Problem Relation Age of Onset  . Obesity Mother   . Kidney disease Father   . Diabetes Father   . Obesity Father   . Hyperlipidemia Father   . Depression Father        and Anxiety    Social History   Socioeconomic History  . Marital status: Single    Spouse name: Not on file  . Number of children: Not on file  . Years of education: college grad sociology  . Highest education level: Not on file  Occupational History  . Occupation: Academic librarian.  Social Needs  . Financial resource strain:  Not on file  . Food insecurity:    Worry: Not on file    Inability: Not on file  . Transportation needs:    Medical: Not on file    Non-medical: Not on file  Tobacco Use  . Smoking status: Former Smoker    Packs/day: 0.25    Years: 9.00    Pack years: 2.25    Types: Cigarettes    Last attempt to quit: 07/16/2016    Years since quitting: 2.1  . Smokeless tobacco: Never Used  Substance and Sexual Activity  . Alcohol use: Yes    Comment: Drinks Friday, Saturday, Sunday  . Drug use: Yes    Types: Marijuana  . Sexual activity: Not on file  Lifestyle  . Physical activity:    Days per week: Not on file    Minutes per session: Not on file  . Stress: Not on file  Relationships  . Social connections:    Talks on phone: Not  on file    Gets together: Not on file    Attends religious service: Not on file    Active member of club or organization: Not on file    Attends meetings of clubs or organizations: Not on file    Relationship status: Not on file  . Intimate partner violence:    Fear of current or ex partner: Not on file    Emotionally abused: Not on file    Physically abused: Not on file    Forced sexual activity: Not on file  Other Topics Concern  . Not on file  Social History Narrative  . Not on file      Review of Systems    Objective:   BP 140/78 (BP Location: Left Arm, Patient Position: Sitting, Cuff Size: Normal)   Pulse 78   Resp 12   Ht 6\' 2"  (1.88 m)   Wt 176 lb (79.8 kg)   BMI 22.60 kg/m   Physical Exam  NAD HEENT:  PERRL, EOMI, No conjunctival injection.  TMs pearly gray, throat without injection. Neck:  Supple, No adenopathy, No thyromegaly Chest:  CTA CV:  RRR with normal S1 and S2, No S3, S4 or murmur.  Radial and DP pulses normal and equal Abd:  S, NT, No HSM or mass + BS Skin:  No rash MS:  No joint swelling or erythema.  Fingertips with normal coloration and good skin turgor.  No capillary loops at nail bases. Traps tender bilaterally.  Most tender over soft tissue at just above superior medial area of left scapular.  Full ROM of shoulders. GU:  No testicular or scrotal mass detected today.  Assessment & Plan  1.  Bipolar Disorder:  Under good control with medication and counseling at [facility].  2.  Left shoulder pain:  Xray of left shoulder when gets financial assistance plus ortho referral as well.  He is to let us know when that happens Referral to [facility], though do not know whether that will occur anytime soon with Coronavirus pandemic  3.  Raynaud's phenomenon:  Discussed taking Procardia or Amlodipine.  He would like to try just staying warm and avoid holding cold objects first.  Will consider medication if not able to control adequately that  way.  4.  Intermittent testicular mass:  No findings today.

## 2018-09-11 ENCOUNTER — Other Ambulatory Visit: Payer: Self-pay

## 2018-10-05 ENCOUNTER — Other Ambulatory Visit: Payer: Self-pay

## 2018-10-05 DIAGNOSIS — Z79899 Other long term (current) drug therapy: Secondary | ICD-10-CM

## 2018-10-05 DIAGNOSIS — Z13220 Encounter for screening for lipoid disorders: Secondary | ICD-10-CM

## 2018-10-06 ENCOUNTER — Telehealth: Payer: Self-pay

## 2018-10-06 LAB — CBC WITH DIFFERENTIAL/PLATELET
Basophils Absolute: 0 10*3/uL (ref 0.0–0.2)
Basos: 0 %
EOS (ABSOLUTE): 0.1 10*3/uL (ref 0.0–0.4)
Eos: 2 %
Hematocrit: 46.1 % (ref 37.5–51.0)
Hemoglobin: 15.3 g/dL (ref 13.0–17.7)
Immature Grans (Abs): 0 10*3/uL (ref 0.0–0.1)
Immature Granulocytes: 0 %
Lymphocytes Absolute: 2.8 10*3/uL (ref 0.7–3.1)
Lymphs: 50 %
MCH: 28.1 pg (ref 26.6–33.0)
MCHC: 33.2 g/dL (ref 31.5–35.7)
MCV: 85 fL (ref 79–97)
Monocytes Absolute: 0.5 10*3/uL (ref 0.1–0.9)
Monocytes: 9 %
Neutrophils Absolute: 2.1 10*3/uL (ref 1.4–7.0)
Neutrophils: 39 %
Platelets: 241 10*3/uL (ref 150–450)
RBC: 5.44 x10E6/uL (ref 4.14–5.80)
RDW: 12.4 % (ref 11.6–15.4)
WBC: 5.5 10*3/uL (ref 3.4–10.8)

## 2018-10-06 LAB — COMPREHENSIVE METABOLIC PANEL
ALT: 14 IU/L (ref 0–44)
AST: 23 IU/L (ref 0–40)
Albumin/Globulin Ratio: 1.7 (ref 1.2–2.2)
Albumin: 4.6 g/dL (ref 4.1–5.2)
Alkaline Phosphatase: 75 IU/L (ref 39–117)
BUN/Creatinine Ratio: 12 (ref 9–20)
BUN: 13 mg/dL (ref 6–20)
Bilirubin Total: 0.6 mg/dL (ref 0.0–1.2)
CO2: 24 mmol/L (ref 20–29)
Calcium: 9.4 mg/dL (ref 8.7–10.2)
Chloride: 101 mmol/L (ref 96–106)
Creatinine, Ser: 1.09 mg/dL (ref 0.76–1.27)
GFR calc Af Amer: 105 mL/min/{1.73_m2} (ref >59)
GFR calc non Af Amer: 91 mL/min/{1.73_m2} (ref >59)
Globulin, Total: 2.7 g/dL (ref 1.5–4.5)
Glucose: 78 mg/dL (ref 65–99)
Potassium: 4.5 mmol/L (ref 3.5–5.2)
Sodium: 140 mmol/L (ref 134–144)
Total Protein: 7.3 g/dL (ref 6.0–8.5)

## 2018-10-06 LAB — LIPID PANEL W/O CHOL/HDL RATIO
Cholesterol, Total: 167 mg/dL (ref 100–199)
HDL: 86 mg/dL (ref >39)
LDL Calculated: 70 mg/dL (ref 0–99)
Triglycerides: 56 mg/dL (ref 0–149)
VLDL Cholesterol Cal: 11 mg/dL (ref 5–40)

## 2018-10-06 NOTE — Telephone Encounter (Signed)
Patient called stating he had been bit by at least 5 ticks this week. States one stayed on him at least 12 hours or more before he got it off. States he removed all of them with no problem. Patient states he is now having stiff neck, slight headache and lightheadedness since yesterday.  Per Dr. Delrae Alfred he should be okay but if he starts to have fever and headache he needs to go to urgent care over the weekend if this happens. Patient has been notified and verbalized understanding.

## 2018-11-08 ENCOUNTER — Ambulatory Visit: Payer: Self-pay | Admitting: Internal Medicine

## 2018-11-08 ENCOUNTER — Encounter: Payer: Self-pay | Admitting: Internal Medicine

## 2018-11-08 ENCOUNTER — Other Ambulatory Visit: Payer: Self-pay

## 2018-11-08 VITALS — BP 126/78 | HR 72 | Resp 12 | Ht 74.0 in | Wt 179.0 lb

## 2018-11-08 DIAGNOSIS — N50819 Testicular pain, unspecified: Secondary | ICD-10-CM

## 2018-11-08 DIAGNOSIS — B350 Tinea barbae and tinea capitis: Secondary | ICD-10-CM

## 2018-11-08 NOTE — Progress Notes (Signed)
    Subjective:    Patient ID: Eric Townsend, male   DOB: [birth year], 28 y.o.   MRN: [id number]   HPI   Intermittent testicular pain:  Can be on either side.  Sometimes throbbing.  Sometimes sharp as well.  Depends on repetition of intercourse.  The more times he ejaculates with his partner, the longer the pain remains.  The discomfort can last for up to 2 days.  Cannot ride a bike, so to speak.   States this has been more of a problem since his surgery 05/27/2014. He has a history of bilateral orchiopexy for intermittent right testicular torsion and varicocelectomy for left Varicocele.       Current Meds  Medication Sig  . divalproex (DEPAKOTE ER) 500 MG 24 hr tablet Take by mouth daily.   No Known Allergies   Review of Systems    Objective:   BP 126/78 (BP Location: Left Arm, Patient Position: Sitting, Cuff Size: Normal)   Pulse 72   Resp 12   Ht 6\' 2"  (1.88 m)   Wt 179 lb (81.2 kg)   BMI 22.98 kg/m   Physical Exam Right scalp with erythema and minimal flaking, but difficult to see with scalp oil he is using--boggy in places  Right varicocele with tenderness where the varicocele is most prominent.  No mass or tenderness on left.  Penis without lesion or discharge.  No external abnormalities.  Assessment & Plan   1.  Scalp inflammation:  To return in 2 days to see if skin flakes to look with KOH.  Likely extensive tinea capitis.  2.  Intermittent bilateral testicular pain:  Likely due to varicocele, particularly right. No findings with left testicle Referral to Urology for further evaluation.  Needs orange card to proceed.

## 2018-11-10 ENCOUNTER — Other Ambulatory Visit: Payer: Self-pay

## 2018-11-10 ENCOUNTER — Ambulatory Visit: Payer: Self-pay | Admitting: Internal Medicine

## 2018-11-10 ENCOUNTER — Encounter: Payer: Self-pay | Admitting: Internal Medicine

## 2018-11-10 VITALS — BP 130/78 | HR 78 | Resp 12 | Ht 74.0 in | Wt 179.0 lb

## 2018-11-10 DIAGNOSIS — B350 Tinea barbae and tinea capitis: Secondary | ICD-10-CM | POA: Insufficient documentation

## 2018-11-10 MED ORDER — TERBINAFINE HCL 250 MG PO TABS: 250.0000 mg | ORAL_TABLET | Freq: Every day | ORAL | 0 refills | Status: DC

## 2018-11-10 NOTE — Patient Instructions (Signed)
Clean all of your hats regularly. Check with close family members/partners/pets where this may have come from.  Wash bedding regularly as well, particularly pillow and pillow case

## 2018-11-10 NOTE — Progress Notes (Signed)
    Subjective:    Patient ID: Eric Townsend, male   DOB: 1991-03-22, 28 y.o.   MRN: [id number]   HPI  Here for skin scrapings and KOH.    Current Meds  Medication Sig  . divalproex (DEPAKOTE ER) 500 MG 24 hr tablet Take by mouth daily.  . traZODone (DESYREL) 100 MG tablet Take 100 mg by mouth at bedtime.   No Known Allergies   Review of Systems    Objective:   BP 130/78 (BP Location: Left Arm, Patient Position: Sitting, Cuff Size: Normal)   Pulse 78   Resp 12   Ht 6\' 2"  (1.88 m)   Wt 179 lb (81.2 kg)   BMI 22.98 kg/m   Physical Exam   Scalp with obvious flaking today, patches of hair loss throughout the scalp, mainly on right. Erythema and bogginess noted with his usual scalp oil treatment not so profound.  Unable to see fungal elements with KOH    Assessment & Plan   Appears to have Tinea capitis, despite no findings on KOH slide.   Terbinafine 250 mg daily for 6 weeks. Follow up then to see how he is doing and check hepatic profile.

## 2018-12-22 ENCOUNTER — Other Ambulatory Visit: Payer: Self-pay

## 2018-12-22 ENCOUNTER — Ambulatory Visit (INDEPENDENT_AMBULATORY_CARE_PROVIDER_SITE_OTHER): Payer: Self-pay | Admitting: Internal Medicine

## 2018-12-22 ENCOUNTER — Encounter: Payer: Self-pay | Admitting: Internal Medicine

## 2018-12-22 VITALS — BP 112/70 | HR 78 | Resp 12 | Ht 74.0 in | Wt 176.0 lb

## 2018-12-22 DIAGNOSIS — B350 Tinea barbae and tinea capitis: Secondary | ICD-10-CM

## 2018-12-22 DIAGNOSIS — Z79899 Other long term (current) drug therapy: Secondary | ICD-10-CM

## 2018-12-22 MED ORDER — TERBINAFINE HCL 250 MG PO TABS: 250.0000 mg | ORAL_TABLET | Freq: Every day | ORAL | 0 refills | Status: DC

## 2018-12-22 NOTE — Progress Notes (Signed)
    Subjective:    Patient ID: Eric Townsend, male   DOB: Aug 30, 1990, 28 y.o.   MRN: [id number]   HPI   Tinea Capitis:  Better, but not completely resolved.  He has 4 days of Terbinafine left.  His girlfriend agrees it is much better per patient.  Not as itchy.  Current Meds  Medication Sig  . divalproex (DEPAKOTE ER) 500 MG 24 hr tablet Take by mouth daily.  Marland Kitchen FLUoxetine (PROZAC) 10 MG tablet Take 10 mg by mouth daily.  Marland Kitchen terbinafine (LAMISIL) 250 MG tablet Take 1 tablet (250 mg total) by mouth daily.  . traZODone (DESYREL) 100 MG tablet Take 100 mg by mouth at bedtime.   No Known Allergies   Review of Systems    Objective:   BP 112/70 (BP Location: Left Arm, Patient Position: Sitting, Cuff Size: Normal)   Pulse 78   Resp 12   Ht 6\' 2"  (1.88 m)   Wt 176 lb (79.8 kg)   BMI 22.60 kg/m   Physical Exam  Flaking is much improved.  No swelling or bogginess of scalp today.  No real erythema, just scattered areas of punctate scabbing.   Assessment & Plan   Tinea capitis:  Improving, but not resolved.  Will extend another 4 weeks and see whether resolved. Discussed getting rid of ball caps Likely a very deep seated fungal infection as has had for 2 years. Hepatic profile today.

## 2018-12-23 LAB — HEPATIC FUNCTION PANEL
ALT: 9 IU/L (ref 0–44)
AST: 20 IU/L (ref 0–40)
Albumin: 4.7 g/dL (ref 4.1–5.2)
Alkaline Phosphatase: 69 IU/L (ref 39–117)
Bilirubin Total: 0.3 mg/dL (ref 0.0–1.2)
Bilirubin, Direct: 0.11 mg/dL (ref 0.00–0.40)
Total Protein: 7.2 g/dL (ref 6.0–8.5)

## 2019-01-14 NOTE — Addendum Note (Signed)
Addended by: Marcelino Duster on: 01/14/2019 03:22 PM   Modules accepted: Level of Service

## 2019-01-16 ENCOUNTER — Ambulatory Visit: Payer: Self-pay | Admitting: Internal Medicine

## 2019-01-16 ENCOUNTER — Encounter: Payer: Self-pay | Admitting: Internal Medicine

## 2019-01-16 ENCOUNTER — Other Ambulatory Visit: Payer: Self-pay

## 2019-01-16 VITALS — BP 118/72 | HR 66 | Resp 12 | Ht 74.0 in | Wt 177.0 lb

## 2019-01-16 DIAGNOSIS — L731 Pseudofolliculitis barbae: Secondary | ICD-10-CM

## 2019-01-16 DIAGNOSIS — B350 Tinea barbae and tinea capitis: Secondary | ICD-10-CM

## 2019-01-16 MED ORDER — TERBINAFINE HCL 250 MG PO TABS: 250.0000 mg | ORAL_TABLET | Freq: Every day | ORAL | 0 refills | Status: DC

## 2019-01-16 NOTE — Progress Notes (Signed)
    Subjective:    Patient ID: Eric Townsend, male   DOB: 06-12-1990, 28 y.o.   MRN: [id number]   HPI   1.  Tinea Capitis:  Has about 6 tabs left of Terbinafine for about an 8 week course.   He and his girlfriend and hairdresser all feel his scalp is better, but still with some lesions.  2.  Ingrown hair in suprapubic area.  Has squeezed it multiple times and can't get it to go away.  Current Meds  Medication Sig  . divalproex (DEPAKOTE ER) 500 MG 24 hr tablet Take by mouth daily.  Marland Kitchen FLUoxetine (PROZAC) 10 MG tablet Take 10 mg by mouth daily.  Marland Kitchen terbinafine (LAMISIL) 250 MG tablet Take 1 tablet (250 mg total) by mouth daily.  . traZODone (DESYREL) 100 MG tablet Take 100 mg by mouth at bedtime.   No Known Allergies   Review of Systems    Objective:   BP 118/72 (BP Location: Left Arm, Patient Position: Sitting, Cuff Size: Normal)   Pulse 66   Resp 12   Ht 6\' 2"  (1.88 m)   Wt 177 lb (80.3 kg)   BMI 22.73 kg/m   Physical Exam  Scalp:  Still with mild inflammation with perifollicular inflammation, flaking and scabbing over right side of head. Much improved from before, but still apparent and still with hair loss in the area involved as well.  No bogginess of scalp. 15 blade used to get skin scrapings for culture from area.  Suprapubic ingrown hair removed--longer piece of hair trapped beneath skin in suprapubic area.  4 mm area of raised skin where previously was. Assessment & Plan   1.  Tinea Capitis:  Skin scrapings for fungal culture as not resolving as quickly as would expect.  To continue Terbinafine until culture result.  2.  Ingrown suprapubic hair.  Discussed the possibility of using wax removal instead of shaving to prevent in future.

## 2019-02-08 LAB — FUNGUS CULTURE, BLOOD

## 2019-05-30 ENCOUNTER — Other Ambulatory Visit: Payer: Self-pay

## 2019-05-30 DIAGNOSIS — Z20822 Contact with and (suspected) exposure to covid-19: Secondary | ICD-10-CM

## 2019-05-31 LAB — NOVEL CORONAVIRUS, NAA: SARS-CoV-2, NAA: NOT DETECTED

## 2019-11-14 ENCOUNTER — Other Ambulatory Visit: Payer: Self-pay | Admitting: Internal Medicine

## 2020-01-08 ENCOUNTER — Encounter: Payer: Self-pay | Admitting: Internal Medicine

## 2020-02-21 ENCOUNTER — Other Ambulatory Visit: Payer: Self-pay

## 2020-02-21 DIAGNOSIS — Z20822 Contact with and (suspected) exposure to covid-19: Secondary | ICD-10-CM

## 2020-02-22 LAB — NOVEL CORONAVIRUS, NAA: SARS-CoV-2, NAA: NOT DETECTED

## 2020-02-22 LAB — SARS-COV-2, NAA 2 DAY TAT

## 2020-04-01 ENCOUNTER — Encounter: Payer: Self-pay | Admitting: Internal Medicine

## 2020-04-01 ENCOUNTER — Encounter (HOSPITAL_COMMUNITY): Payer: Self-pay | Admitting: Psychiatry

## 2020-04-01 ENCOUNTER — Other Ambulatory Visit: Payer: Self-pay

## 2020-04-01 ENCOUNTER — Ambulatory Visit (INDEPENDENT_AMBULATORY_CARE_PROVIDER_SITE_OTHER): Payer: No Payment, Other | Admitting: Psychiatry

## 2020-04-01 VITALS — BP 124/69 | HR 77 | Temp 98.4°F | Ht 75.0 in | Wt 184.0 lb

## 2020-04-01 DIAGNOSIS — F900 Attention-deficit hyperactivity disorder, predominantly inattentive type: Secondary | ICD-10-CM | POA: Diagnosis not present

## 2020-04-01 DIAGNOSIS — F3181 Bipolar II disorder: Secondary | ICD-10-CM | POA: Diagnosis not present

## 2020-04-01 MED ORDER — DIVALPROEX SODIUM ER 500 MG PO TB24: 500.0000 mg | ORAL_TABLET | Freq: Every day | ORAL | 2 refills | Status: DC

## 2020-04-01 MED ORDER — TRAZODONE HCL 100 MG PO TABS: 100.0000 mg | ORAL_TABLET | Freq: Every day | ORAL | 2 refills | Status: DC

## 2020-04-01 MED ORDER — LAMOTRIGINE 25 MG PO TABS: 25.0000 mg | ORAL_TABLET | Freq: Every day | ORAL | 2 refills | Status: DC

## 2020-04-01 MED ORDER — BUPROPION HCL ER (XL) 300 MG PO TB24: 300.0000 mg | ORAL_TABLET | ORAL | 2 refills | Status: DC
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
## 2020-05-05 ENCOUNTER — Other Ambulatory Visit: Payer: Self-pay

## 2020-05-05 ENCOUNTER — Ambulatory Visit (INDEPENDENT_AMBULATORY_CARE_PROVIDER_SITE_OTHER): Payer: No Payment, Other | Admitting: Psychiatry

## 2020-05-05 ENCOUNTER — Encounter (HOSPITAL_COMMUNITY): Payer: Self-pay | Admitting: Psychiatry

## 2020-05-05 DIAGNOSIS — F900 Attention-deficit hyperactivity disorder, predominantly inattentive type: Secondary | ICD-10-CM

## 2020-05-05 DIAGNOSIS — F3181 Bipolar II disorder: Secondary | ICD-10-CM | POA: Diagnosis not present

## 2020-05-05 MED ORDER — TRAZODONE HCL 100 MG PO TABS: 100.0000 mg | ORAL_TABLET | Freq: Every day | ORAL | 2 refills | Status: DC

## 2020-05-05 MED ORDER — BUPROPION HCL ER (XL) 300 MG PO TB24: 300.0000 mg | ORAL_TABLET | ORAL | 2 refills | Status: DC

## 2020-05-05 MED ORDER — LAMOTRIGINE 25 MG PO TABS: 25.0000 mg | ORAL_TABLET | Freq: Every day | ORAL | 2 refills | Status: DC

## 2020-05-05 NOTE — Progress Notes (Signed)
BH MD/PA/NP OP Progress Note  05/05/2020 12:16 PM Eric Townsend  MRN:  [id number]  Chief Complaint: "I haven't started the Lamictal and I tapered off of Depakote" Chief Complaint    Weight Loss      HPI: 29 year old male seen today for follow up psychiatric evaluation.  He psychiatric history of anxiety, depression, ADHD, and bipolar 2 disorder.  He is currently managed on Depakote 500 mg daily, trazodone 100 mg nightly, Lamictal 25 mg daily, and Wellbutrin 150 mg every morning. He informed provider that he discontinued Depakote and has not start to taking Lamictal due to researched interactions.  Today patient is well-groomed, pleasant, cooperative, talkative, and engaged in conversation.  Patient notes that his mood is anxious and depressed.  Provider conducted a GAD-7 and patient scored a 9, at last visit patient scored a 7.  Provider also conducted a PHQ-9 and patient scored a 10, at last visit patient also scored a 10.    During exam his speech is somewhat pressured.  He endorses fluctuation in mood, racing thoughts, irritability, and impulsive spending.  During exam his speech is somewhat pressured.   He denies SI/HI/VH or paranoia.  He notes that he sleeps 6 hours nightly and notes that he has a good appetite  Patient informed Clinical research associate that he recently completed a class that help people who are incarcerated.  He notes that he continues to look for a job and is hopeful that he will find something soon.  Patient agreeable to start Lamictal 25 mg for 2 weeks and then increase to 50 mg 2 weeks later to help stabilize mood. Potential side effects of medication and risks vs benefits of treatment vs non-treatment were explained and discussed. All questions were answered.   He will discontinue Depakote 500 mg daily and will continue all other medications as prescribed.  Patient will follow up with counseling at [facility] counseling.  No other concerns noted at this time.  Visit Diagnosis:     ICD-10-CM   1. Bipolar II disorder (HCC)  F31.81 buPROPion (WELLBUTRIN XL) 300 MG 24 hr tablet    lamoTRIgine (LAMICTAL) 25 MG tablet    traZODone (DESYREL) 100 MG tablet  2. Attention deficit hyperactivity disorder (ADHD), predominantly inattentive type  F90.0 buPROPion (WELLBUTRIN XL) 300 MG 24 hr tablet    Past Psychiatric History:  anxiety, depression, ADHD, and bipolar 2 disorder  Past Medical History:  Past Medical History:  Diagnosis Date  . Generalized anxiety disorder    Was seen at [facility] for this or [facility] student counseling services. Has been on mediction:  Zoloft 100 mg--doesn't like, Prozac 40 mg  . Testicular torsion     Past Surgical History:  Procedure Laterality Date  . SURGERY SCROTAL / TESTICULAR Bilateral 05/2014   Removed left varicocele and performed bilateral orchiopexy    Family Psychiatric History: Father anxiety and depression. Mother anxiety  Family History:  Family History  Problem Relation Age of Onset  . Obesity Mother   . Kidney disease Father   . Diabetes Father   . Obesity Father   . Hyperlipidemia Father   . Depression Father        and Anxiety    Social History:  Social History   Socioeconomic History  . Marital status: Single    Spouse name: Not on file  . Number of children: Not on file  . Years of education: college grad sociology  . Highest education level: Not on file  Occupational History  .  Occupation: Academic librarian.  Tobacco Use  . Smoking status: Former Smoker    Packs/day: 0.25    Years: 9.00    Pack years: 2.25    Types: Cigarettes    Quit date: 07/16/2016    Years since quitting: 3.8  . Smokeless tobacco: Never Used  Substance and Sexual Activity  . Alcohol use: Yes    Comment: Drinks Friday, Saturday, Sunday  . Drug use: Yes    Types: Marijuana  . Sexual activity: Not on file  Other Topics Concern  . Not on file  Social History Narrative  . Not on file   Social Determinants of Health   Financial  Resource Strain: Not on file  Food Insecurity: Not on file  Transportation Needs: Not on file  Physical Activity: Not on file  Stress: Not on file  Social Connections: Not on file    Allergies: No Known Allergies  Metabolic Disorder Labs: No results found for: HGBA1C, MPG No results found for: PROLACTIN Lab Results  Component Value Date   CHOL 167 10/05/2018   TRIG 56 10/05/2018   HDL 86 10/05/2018   LDLCALC 70 10/05/2018   No results found for: TSH  Therapeutic Level Labs: No results found for: LITHIUM No results found for: VALPROATE No components found for:  CBMZ  Current Medications: Current Outpatient Medications  Medication Sig Dispense Refill  . terbinafine (LAMISIL) 250 MG tablet Take 1 tablet (250 mg total) by mouth daily. 30 tablet 0  . buPROPion (WELLBUTRIN XL) 300 MG 24 hr tablet Take 1 tablet (300 mg total) by mouth every morning. 30 tablet 2  . lamoTRIgine (LAMICTAL) 25 MG tablet Take 1 tablet (25 mg total) by mouth daily. 50 tablet 2  . traZODone (DESYREL) 100 MG tablet Take 1 tablet (100 mg total) by mouth at bedtime. 30 tablet 2   No current facility-administered medications for this visit.     Musculoskeletal: Strength & Muscle Tone: within normal limits Gait & Station: normal Patient leans: N/A  Psychiatric Specialty Exam: Review of Systems  Blood pressure 132/77, pulse 92, height 6\' 3"  (1.905 m), weight 186 lb (84.4 kg), SpO2 100 %.Body mass index is 23.25 kg/m.  General Appearance: Well Groomed  Eye Contact:  Good  Speech:  Clear and Coherent and Pressured  Volume:  Normal  Mood:  Anxious and Depressed  Affect:  Appropriate  Thought Process:  Coherent, Goal Directed and Linear  Orientation:  Full (Time, Place, and Person)  Thought Content: WDL and Logical   Suicidal Thoughts:  No  Homicidal Thoughts:  No  Memory:  Immediate;   Good Recent;   Good Remote;   Good  Judgement:  Fair  Insight:  Good  Psychomotor Activity:  Normal   Concentration:  Concentration: Fair and Attention Span: Fair  Recall:  Good  Fund of Knowledge: Good  Language: Good  Akathisia:  No  Handed:  Right  AIMS (if indicated): Not done  Assets:  Communication Skills Desire for Improvement Housing Intimacy Leisure Time Physical Health Social Support  ADL's:  Intact  Cognition: WNL  Sleep:  Fair   Screenings: GAD-7   Flowsheet Row Clinical Support from 05/05/2020 in [facility]01/[facility] in [facility]  Total GAD-7 Score 9 7    PHQ2-9   Flowsheet Row Clinical Support from 05/05/2020 in [facility] Office Visit from 04/01/2020 in [place]  PHQ-2 Total Score 2  2  PHQ-9 Total Score 10 10       Assessment and Plan: Patient endorses symptoms of anxiety, depression, and hypomania. He is agreeable to start Lamictal 25 mg for 2 weeks and then increase to 50 mg 2 weeks later to help stabilize mood.   He will discontinue Depakote 500 mg daily and will continue all other medications as prescribed.  Patient will follow up with counseling at [facility] counseling.  No other concerns noted at this time.  1. Bipolar II disorder (HCC)  Continue- buPROPion (WELLBUTRIN XL) 300 MG 24 hr tablet; Take 1 tablet (300 mg total) by mouth every morning.  Dispense: 30 tablet; Refill: 2 Start- lamoTRIgine (LAMICTAL) 25 MG tablet; Take 1 tablet (25 mg total) by mouth daily.  Dispense: 50 tablet; Refill: 2 Continue- traZODone (DESYREL) 100 MG tablet; Take 1 tablet (100 mg total) by mouth at bedtime.  Dispense: 30 tablet; Refill: 2  2. Attention deficit hyperactivity disorder (ADHD), predominantly inattentive type  Continue- buPROPion (WELLBUTRIN XL) 300 MG 24 hr tablet; Take 1 tablet (300 mg total) by mouth every morning.  Dispense: 30 tablet; Refill: 2  Follow-up in 1 months Follow-up with therapy  Shanna Cisco,  NP 05/05/2020, 12:16 PM

## 2020-05-30 ENCOUNTER — Ambulatory Visit: Payer: Self-pay | Admitting: Internal Medicine

## 2020-05-30 ENCOUNTER — Encounter: Payer: Self-pay | Admitting: Internal Medicine

## 2020-05-30 ENCOUNTER — Other Ambulatory Visit: Payer: Self-pay

## 2020-05-30 VITALS — BP 110/72 | HR 72 | Resp 12 | Ht 74.25 in | Wt 179.2 lb

## 2020-05-30 DIAGNOSIS — Z0000 Encounter for general adult medical examination without abnormal findings: Secondary | ICD-10-CM

## 2020-05-30 DIAGNOSIS — F319 Bipolar disorder, unspecified: Secondary | ICD-10-CM

## 2020-05-30 DIAGNOSIS — G8929 Other chronic pain: Secondary | ICD-10-CM

## 2020-05-30 DIAGNOSIS — M25512 Pain in left shoulder: Secondary | ICD-10-CM

## 2020-05-30 DIAGNOSIS — B350 Tinea barbae and tinea capitis: Secondary | ICD-10-CM

## 2020-05-30 DIAGNOSIS — Z23 Encounter for immunization: Secondary | ICD-10-CM

## 2020-05-30 DIAGNOSIS — Z113 Encounter for screening for infections with a predominantly sexual mode of transmission: Secondary | ICD-10-CM

## 2020-05-30 MED ORDER — TERBINAFINE HCL 250 MG PO TABS
250.0000 mg | ORAL_TABLET | Freq: Every day | ORAL | 0 refills | Status: DC
Start: 2020-05-30 — End: 2020-07-08

## 2020-05-30 NOTE — Progress Notes (Signed)
Subjective:    Patient ID: Eric Townsend, male   DOB: 1990/10/24, 30 y.o.   MRN: [id number]   HPI   Here for Male CPE:  1.  STE:  Occasionally with swelling at posterior area of right testicle.  History of orchiopexy for right testicular torsion and removal of left varicocele.  No family history of testicular cancer.  2.  PSA: Never.  3.  Guaiac Cards:    4.  Colonoscopy: Never.  No family history of colon cancer.  5.  Cholesterol/Glucose:  Terrific cholesterol panel in 2020.  No history of elevated glucose.  6.  Immunizations: Would like influenza vaccination today. See completed childhood vaccines in [facility]. Immunization History  Administered Date(s) Administered  . Influenza Inj Mdck Quad With Preservative 05/30/2020  . PFIZER SARS-COV-2 Vaccination 08/03/2019, 08/27/2019, 05/02/2020  . Tdap 12/05/2012     Current Meds  Medication Sig  . buPROPion (WELLBUTRIN XL) 300 MG 24 hr tablet Take 1 tablet (300 mg total) by mouth every morning.  . lamoTRIgine (LAMICTAL) 25 MG tablet Take 1 tablet (25 mg total) by mouth daily.  . traZODone (DESYREL) 100 MG tablet Take 1 tablet (100 mg total) by mouth at bedtime.   No Known Allergies   Past Medical History:  Diagnosis Date  . Bipolar disorder (HCC)   . Generalized anxiety disorder    Was seen at [facility] for this or [facility] student counseling services. Has been on mediction:  Zoloft 100 mg--doesn't like, Prozac 40 mg  . Testicular torsion     Past Surgical History:  Procedure Laterality Date  . SURGERY SCROTAL / TESTICULAR Bilateral 05/2014   Removed left varicocele and performed bilateral orchiopexy    Family History  Problem Relation Age of Onset  . Obesity Mother   . Other Mother        Prediabetes  . Kidney disease Father   . Diabetes Father   . Obesity Father   . Hyperlipidemia Father   . Depression Father        and Anxiety  . Other Father 106       COVID    Social History   Socioeconomic History  .  Marital status: Media planner    Spouse name: Albin Felling  . Number of children: 0  . Years of education: college grad sociology  . Highest education level: Not on file  Occupational History  . Occupation: Academic librarian.  Tobacco Use  . Smoking status: Former Smoker    Packs/day: 0.25    Years: 9.00    Pack years: 2.25    Types: Cigarettes    Quit date: 07/16/2018    Years since quitting: 1.8  . Smokeless tobacco: Never Used  Substance and Sexual Activity  . Alcohol use: Yes    Comment: History of too much, but now just occasional.  . Drug use: Yes    Types: Marijuana  . Sexual activity: Yes    Birth control/protection: Condom  Other Topics Concern  . Not on file  Social History Narrative   Currently not working since 12/2019   Lives with long term girlfriend, Albin Felling, who is a high Engineer, site.   Social Determinants of Health   Financial Resource Strain: Not on file  Food Insecurity: Not on file  Transportation Needs: Not on file  Physical Activity: Not on file  Stress: Not on file  Social Connections: Not on file  Intimate Partner Violence: Not on file   Review of Systems  Respiratory: Negative for chest tightness.   Gastrointestinal:       Cramping, bloating after eating and then can have loose stools about 12 hours later.  No blood or melena.  Seems related to milk intake.   Loose stool is not as frequent.   Has not tried gluten free products.   Musculoskeletal:       Shoulder and upper back pain:  Felt better with PT and dry needling, but now both shoulders hurt.  Uses a back pack       Objective:   BP 110/72 (BP Location: Left Arm, Patient Position: Sitting, Cuff Size: Normal)   Pulse 72   Resp 12   Ht 6' 2.25" (1.886 m)   Wt 179 lb 4 oz (81.3 kg)   BMI 22.86 kg/m   Physical Exam Constitutional:      Appearance: Normal appearance.  HENT:     Head: Normocephalic and atraumatic.     Right Ear: Hearing, tympanic membrane, ear canal and external  ear normal.     Left Ear: Hearing, tympanic membrane, ear canal and external ear normal.     Nose: Nose normal.     Mouth/Throat:     Mouth: Mucous membranes are moist.     Pharynx: Oropharynx is clear. Uvula midline.  Eyes:     General: Gaze aligned appropriately.     Extraocular Movements: Extraocular movements intact.     Conjunctiva/sclera: Conjunctivae normal.     Pupils: Pupils are equal, round, and reactive to light.     Funduscopic exam:    Right eye: Red reflex present.        Left eye: Red reflex present. Neck:     Thyroid: No thyroid mass or thyromegaly.  Cardiovascular:     Rate and Rhythm: Normal rate and regular rhythm.     Pulses: Normal pulses.     Heart sounds: S1 normal and S2 normal. No murmur heard. No friction rub. No S3 or S4 sounds.   Pulmonary:     Effort: Pulmonary effort is normal.     Breath sounds: Normal breath sounds.  Chest:  Breasts:     Right: No axillary adenopathy or supraclavicular adenopathy.     Left: No axillary adenopathy or supraclavicular adenopathy.    Abdominal:     General: Bowel sounds are normal.     Palpations: Abdomen is soft. There is no hepatomegaly, splenomegaly or mass.     Tenderness: There is no abdominal tenderness.     Hernia: No hernia is present.  Genitourinary:    Penis: Normal.      Testes:        Right: Mass, tenderness, swelling or varicocele not present. Right testis is descended.        Left: Mass, tenderness, swelling or varicocele not present. Left testis is descended.  Musculoskeletal:        General: Normal range of motion.     Cervical back: Normal range of motion and neck supple.     Right lower leg: No edema.     Left lower leg: No edema.  Lymphadenopathy:     Head:     Right side of head: No submental or submandibular adenopathy.     Left side of head: No submental or submandibular adenopathy.     Cervical: No cervical adenopathy.     Upper Body:     Right upper body: No supraclavicular or  axillary adenopathy.     Left upper body: No supraclavicular  or axillary adenopathy.     Lower Body: No right inguinal adenopathy. No left inguinal adenopathy.  Skin:    General: Skin is warm.     Capillary Refill: Capillary refill takes less than 2 seconds.       Neurological:     Mental Status: He is alert.     Cranial Nerves: Cranial nerves are intact.     Sensory: Sensation is intact.     Motor: Motor function is intact.     Coordination: Coordination is intact.     Gait: Gait is intact.     Deep Tendon Reflexes: Reflexes are normal and symmetric.  Psychiatric:        Mood and Affect: Mood normal.        Speech: Speech normal.        Behavior: Behavior normal.        Thought Content: Thought content normal.        Judgment: Judgment normal.      Assessment & Plan  1.  CPE:   CBC, CMP, FLP, HIV, RPR, GC/chlamydia Influenza vaccine Check with patient on follow up if he had chicken pox as child--no documentation of varicella vaccine.  2.  Tinea capitis:  6 week course of Terbinafine 250 mg daily.  When last seen, he had entire scalp involvement--now a small area.  Scrapings after treated with Terbinafine did not show growth of mold or fungus.  3.  Bilateral shoulder and upper back pain:  Referral again to [facility] bono clinic.  4.  Bipolar disorder:  Gives history that is improved on current therapy through [facility]

## 2020-06-01 LAB — CBC WITH DIFFERENTIAL/PLATELET
Basophils Absolute: 0 10*3/uL (ref 0.0–0.2)
Basos: 1 %
EOS (ABSOLUTE): 0.1 10*3/uL (ref 0.0–0.4)
Eos: 2 %
Hematocrit: 51.4 % — ABNORMAL HIGH (ref 37.5–51.0)
Hemoglobin: 17.1 g/dL (ref 13.0–17.7)
Immature Grans (Abs): 0 10*3/uL (ref 0.0–0.1)
Immature Granulocytes: 0 %
Lymphocytes Absolute: 2.7 10*3/uL (ref 0.7–3.1)
Lymphs: 62 %
MCH: 28.6 pg (ref 26.6–33.0)
MCHC: 33.3 g/dL (ref 31.5–35.7)
MCV: 86 fL (ref 79–97)
Monocytes Absolute: 0.3 10*3/uL (ref 0.1–0.9)
Monocytes: 7 %
Neutrophils Absolute: 1.2 10*3/uL — ABNORMAL LOW (ref 1.4–7.0)
Neutrophils: 28 %
Platelets: 268 10*3/uL (ref 150–450)
RBC: 5.98 x10E6/uL — ABNORMAL HIGH (ref 4.14–5.80)
RDW: 11.9 % (ref 11.6–15.4)
WBC: 4.3 10*3/uL (ref 3.4–10.8)

## 2020-06-01 LAB — LIPID PANEL W/O CHOL/HDL RATIO
Cholesterol, Total: 228 mg/dL — ABNORMAL HIGH (ref 100–199)
HDL: 109 mg/dL (ref >39)
LDL Chol Calc (NIH): 107 mg/dL — ABNORMAL HIGH (ref 0–99)
Triglycerides: 68 mg/dL (ref 0–149)
VLDL Cholesterol Cal: 12 mg/dL (ref 5–40)

## 2020-06-01 LAB — COMPREHENSIVE METABOLIC PANEL
ALT: 16 IU/L (ref 0–44)
AST: 22 IU/L (ref 0–40)
Albumin/Globulin Ratio: 1.6 (ref 1.2–2.2)
Albumin: 5.2 g/dL — ABNORMAL HIGH (ref 4.0–5.0)
Alkaline Phosphatase: 99 IU/L (ref 44–121)
BUN/Creatinine Ratio: 11 (ref 9–20)
BUN: 13 mg/dL (ref 6–20)
Bilirubin Total: 0.4 mg/dL (ref 0.0–1.2)
CO2: 23 mmol/L (ref 20–29)
Calcium: 10.1 mg/dL (ref 8.7–10.2)
Chloride: 101 mmol/L (ref 96–106)
Creatinine, Ser: 1.19 mg/dL (ref 0.76–1.27)
GFR calc Af Amer: 94 mL/min/{1.73_m2} (ref >59)
GFR calc non Af Amer: 81 mL/min/{1.73_m2} (ref >59)
Globulin, Total: 3.2 g/dL (ref 1.5–4.5)
Glucose: 79 mg/dL (ref 65–99)
Potassium: 4.4 mmol/L (ref 3.5–5.2)
Sodium: 141 mmol/L (ref 134–144)
Total Protein: 8.4 g/dL (ref 6.0–8.5)

## 2020-06-01 LAB — HIV ANTIBODY (ROUTINE TESTING W REFLEX): HIV Screen 4th Generation wRfx: NONREACTIVE

## 2020-06-01 LAB — RPR QUALITATIVE: RPR Ser Ql: NONREACTIVE

## 2020-06-02 LAB — GC/CHLAMYDIA PROBE AMP
Chlamydia trachomatis, NAA: NEGATIVE
Neisseria Gonorrhoeae by PCR: NEGATIVE

## 2020-06-04 ENCOUNTER — Encounter (HOSPITAL_COMMUNITY): Payer: Self-pay | Admitting: Psychiatry

## 2020-06-04 ENCOUNTER — Encounter (HOSPITAL_COMMUNITY): Payer: Self-pay

## 2020-06-04 ENCOUNTER — Ambulatory Visit (INDEPENDENT_AMBULATORY_CARE_PROVIDER_SITE_OTHER): Payer: No Payment, Other | Admitting: Psychiatry

## 2020-06-04 ENCOUNTER — Other Ambulatory Visit: Payer: Self-pay

## 2020-06-04 DIAGNOSIS — F900 Attention-deficit hyperactivity disorder, predominantly inattentive type: Secondary | ICD-10-CM

## 2020-06-04 DIAGNOSIS — F3181 Bipolar II disorder: Secondary | ICD-10-CM | POA: Diagnosis not present

## 2020-06-04 MED ORDER — BUPROPION HCL ER (XL) 300 MG PO TB24: 300.0000 mg | ORAL_TABLET | ORAL | 2 refills | Status: DC

## 2020-06-04 MED ORDER — LAMOTRIGINE 25 MG PO TABS: 50.0000 mg | ORAL_TABLET | Freq: Every day | ORAL | 1 refills | Status: DC

## 2020-06-04 MED ORDER — TRAZODONE HCL 100 MG PO TABS: 100.0000 mg | ORAL_TABLET | Freq: Every day | ORAL | 2 refills | Status: DC

## 2020-06-04 NOTE — Progress Notes (Signed)
BH MD/PA/NP OP Progress Note  06/04/2020 10:03 AM Eric Townsend  MRN:  [id number]  Chief Complaint: "The main thing i've noticed is irritability"   HPI: 30 year old male seen today for follow up psychiatric evaluation.  He psychiatric history of anxiety, depression, ADHD, and bipolar 2 disorder.  He is currently managed on Lamictal 50 daily (notes that he has only been taking 25 mg), trazodone 100 mg nightly, and Wellbutrin 300 mg every morning. He informed provider that his medication is effective in managing his psychiatric history.  Today patient is well-groomed, pleasant, cooperative, talkative, and engaged in conversation.  Patient notes that his anxiety and depression have improved since his last visit. He however notes at times he feels irritable. He notes that his irritability is due to life stressors like not having a job. He notes that overall his mood is more stable on Lamictal than Depakote.  Today provider conducted a GAD-7 and patient scored a 8, at last visit he scored a 9, at last visit patient scored a 7.  Provider also conducted a PHQ-9 and patient scored a 7, at last visit he scored a 10. Today denies SI/HI/VH, mania, or paranoia.  He notes that he sleeps has improved and notes that he has good appetite  Patient informed Clinical research associate that he recently completed a class that help people who are incarcerated.  He notes that he continues to look for a job and is hopeful that he will find something soon.  Patient agreeable to increase Lamictal 25 mg to 50 mg 2 weeks and the increase to 100 mg to help stabilize mood. Patient will follow up with counseling at [facility] counseling.  No other concerns noted at this time.  Visit Diagnosis:    ICD-10-CM   1. Bipolar II disorder (HCC)  F31.81 buPROPion (WELLBUTRIN XL) 300 MG 24 hr tablet    lamoTRIgine (LAMICTAL) 25 MG tablet    traZODone (DESYREL) 100 MG tablet  2. Attention deficit hyperactivity disorder (ADHD), predominantly inattentive  type  F90.0 buPROPion (WELLBUTRIN XL) 300 MG 24 hr tablet    Past Psychiatric History:  anxiety, depression, ADHD, and bipolar 2 disorder  Past Medical History:  Past Medical History:  Diagnosis Date  . Bipolar disorder (HCC)   . Generalized anxiety disorder    Was seen at [facility] for this or [facility] student counseling services. Has been on mediction:  Zoloft 100 mg--doesn't like, Prozac 40 mg  . Testicular torsion     Past Surgical History:  Procedure Laterality Date  . SURGERY SCROTAL / TESTICULAR Bilateral 05/2014   Removed left varicocele and performed bilateral orchiopexy    Family Psychiatric History: Father anxiety and depression. Mother anxiety  Family History:  Family History  Problem Relation Age of Onset  . Obesity Mother   . Other Mother        Prediabetes  . Kidney disease Father   . Diabetes Father   . Obesity Father   . Hyperlipidemia Father   . Depression Father        and Anxiety  . Other Father 41       COVID    Social History:  Social History   Socioeconomic History  . Marital status: Media planner    Spouse name: Albin Felling  . Number of children: 0  . Years of education: college grad sociology  . Highest education level: Not on file  Occupational History  . Occupation: Academic librarian.  Tobacco Use  . Smoking status: Former Smoker  Packs/day: 0.25    Years: 9.00    Pack years: 2.25    Types: Cigarettes    Quit date: 07/16/2018    Years since quitting: 1.8  . Smokeless tobacco: Never Used  Substance and Sexual Activity  . Alcohol use: Yes    Comment: History of too much, but now just occasional.  . Drug use: Yes    Types: Marijuana  . Sexual activity: Yes    Birth control/protection: Condom  Other Topics Concern  . Not on file  Social History Narrative   Currently not working since 12/2019   Lives with long term girlfriend, Albin Felling, who is a high Engineer, site.   Social Determinants of Health   Financial Resource Strain: Not on  file  Food Insecurity: Not on file  Transportation Needs: Not on file  Physical Activity: Not on file  Stress: Not on file  Social Connections: Not on file    Allergies: No Known Allergies  Metabolic Disorder Labs: No results found for: HGBA1C, MPG No results found for: PROLACTIN Lab Results  Component Value Date   CHOL 228 (H) 05/30/2020   TRIG 68 05/30/2020   HDL 109 05/30/2020   LDLCALC 107 (H) 05/30/2020   LDLCALC 70 10/05/2018   No results found for: TSH  Therapeutic Level Labs: No results found for: LITHIUM No results found for: VALPROATE No components found for:  CBMZ  Current Medications: Current Outpatient Medications  Medication Sig Dispense Refill  . buPROPion (WELLBUTRIN XL) 300 MG 24 hr tablet Take 1 tablet (300 mg total) by mouth every morning. 30 tablet 2  . lamoTRIgine (LAMICTAL) 25 MG tablet Take 2 tablets (50 mg total) by mouth daily. 90 tablet 1  . terbinafine (LAMISIL) 250 MG tablet Take 1 tablet (250 mg total) by mouth daily. 42 tablet 0  . traZODone (DESYREL) 100 MG tablet Take 1 tablet (100 mg total) by mouth at bedtime. 30 tablet 2   No current facility-administered medications for this visit.     Musculoskeletal: Strength & Muscle Tone: within normal limits Gait & Station: normal Patient leans: N/A  Psychiatric Specialty Exam: Review of Systems  There were no vitals taken for this visit.There is no height or weight on file to calculate BMI.  General Appearance: Well Groomed  Eye Contact:  Good  Speech:  Clear and Coherent and Pressured  Volume:  Normal  Mood:  Euthymic  Affect:  Appropriate  Thought Process:  Coherent, Goal Directed and Linear  Orientation:  Full (Time, Place, and Person)  Thought Content: WDL and Logical   Suicidal Thoughts:  No  Homicidal Thoughts:  No  Memory:  Immediate;   Good Recent;   Good Remote;   Good  Judgement:  Fair  Insight:  Good  Psychomotor Activity:  Normal  Concentration:  Concentration: Fair  and Attention Span: Fair  Recall:  Good  Fund of Knowledge: Good  Language: Good  Akathisia:  No  Handed:  Right  AIMS (if indicated): Not done  Assets:  Communication Skills Desire for Improvement Housing Intimacy Leisure Time Physical Health Social Support  ADL's:  Intact  Cognition: WNL  Sleep:  Good   Screenings: GAD-7   Flowsheet Row Clinical Support from 06/04/2020 in [facility] Clinical Support from 05/05/2020 in [facility] Office Visit from 04/01/2020 in [facility]  Total GAD-7 Score 8 9 7     PHQ2-9   Flowsheet Row Clinical Support from 06/04/2020 in [place]  [facility] Clinical Support from 05/05/2020 in [facility] Office Visit from 04/01/2020 in [place]  PHQ-2 Total Score 3 2 2   PHQ-9 Total Score 7 10 10        Assessment and Plan: Patient that his anxiety and depression have improved since last visit. Patient agreeable to increase Lamictal 25 mg to 50 mg 2 weeks and the increase to 100 mg to help stabilize mood. Patient will follow up with counseling at [facility] counseling.    1. Bipolar II disorder (HCC)  Continue- buPROPion (WELLBUTRIN XL) 300 MG 24 hr tablet; Take 1 tablet (300 mg total) by mouth every morning.  Dispense: 30 tablet; Refill: 2 Increased- lamoTRIgine (LAMICTAL) 25 MG tablet; Take 2 tablets (50 mg total) by mouth daily.  Dispense: 90 tablet; Refill: 1 Continue- traZODone (DESYREL) 100 MG tablet; Take 1 tablet (100 mg total) by mouth at bedtime.  Dispense: 30 tablet; Refill: 2  2. Attention deficit hyperactivity disorder (ADHD), predominantly inattentive type  Continue- buPROPion (WELLBUTRIN XL) 300 MG 24 hr tablet; Take 1 tablet (300 mg total) by mouth every morning.  Dispense: 30 tablet; Refill: 2   Follow-up in 1 months Follow-up with therapy  ,  NP 06/04/2020, 10:03 AM

## 2020-07-07 ENCOUNTER — Telehealth: Payer: Self-pay | Admitting: Clinical

## 2020-07-08 MED ORDER — TERBINAFINE HCL 250 MG PO TABS: 250.0000 mg | ORAL_TABLET | Freq: Every day | ORAL | 0 refills | Status: DC

## 2020-07-08 NOTE — Telephone Encounter (Signed)
I sent it where it was least expensive and where I gave the coupon for.   I can send to [facility] near [place], but he will have to get the coupon as you described from [employer]

## 2020-07-15 ENCOUNTER — Encounter (HOSPITAL_COMMUNITY): Payer: Self-pay | Admitting: Psychiatry

## 2020-07-15 ENCOUNTER — Ambulatory Visit (INDEPENDENT_AMBULATORY_CARE_PROVIDER_SITE_OTHER): Payer: No Payment, Other | Admitting: Psychiatry

## 2020-07-15 ENCOUNTER — Other Ambulatory Visit: Payer: Self-pay

## 2020-07-15 DIAGNOSIS — F900 Attention-deficit hyperactivity disorder, predominantly inattentive type: Secondary | ICD-10-CM | POA: Diagnosis not present

## 2020-07-15 DIAGNOSIS — F3181 Bipolar II disorder: Secondary | ICD-10-CM

## 2020-07-15 MED ORDER — TRAZODONE HCL 100 MG PO TABS: 100.0000 mg | ORAL_TABLET | Freq: Every day | ORAL | 2 refills | Status: DC

## 2020-07-15 MED ORDER — BUPROPION HCL ER (XL) 300 MG PO TB24: 300.0000 mg | ORAL_TABLET | ORAL | 2 refills | Status: DC

## 2020-07-15 MED ORDER — LAMOTRIGINE 100 MG PO TABS: 100.0000 mg | ORAL_TABLET | Freq: Every day | ORAL | 2 refills | Status: DC

## 2020-07-15 NOTE — Progress Notes (Signed)
BH MD/PA/NP OP Progress Note  07/15/2020 11:16 AM Eric Townsend  MRN:  [id number]  Chief Complaint: "The Lamictal is working" Chief Complaint    Follow-up      HPI: 29 year old male seen today for follow up psychiatric evaluation.  He psychiatric history of anxiety, depression, ADHD, and bipolar 2 disorder.  He is currently managed on Lamictal 100 daily, trazodone 100 mg nightly, and Wellbutrin 300 mg every morning. He informed provider that his medication is effective in managing his psychiatric history.  Today patient is well-groomed, pleasant, cooperative,and engaged in conversation.  Patient notes that he has been doing well on his current medication regimen.  He informed Clinical research associate that his girlfriend has noticed a change in him as well.  He notes that he has very little anxiety and depression.  At times he notes that he is anxious about getting a job but reports overall he is doing well.  He notes that he sleeps at least 6 to 7 hours nightly and reports that he eats well.  Today he denies SI/HI/VAH or paranoia.    Patient informed Clinical research associate that he recently he had an interview at [employer].  He also notes that he had an interview at [employer].  He informed Clinical research associate that he is hopeful that he will receive one of these positions.  He notes that both of the positions help disabled people or refugees.    No medication changes made today.  Patient will continue medications as prescribed.  He will follow up with counseling at [facility] counseling.  No other concerns noted at this time.  Visit Diagnosis:    ICD-10-CM   1. Bipolar II disorder (HCC)  F31.81 buPROPion (WELLBUTRIN XL) 300 MG 24 hr tablet    lamoTRIgine (LAMICTAL) 100 MG tablet    traZODone (DESYREL) 100 MG tablet  2. Attention deficit hyperactivity disorder (ADHD), predominantly inattentive type  F90.0 buPROPion (WELLBUTRIN XL) 300 MG 24 hr tablet    Past Psychiatric History:  anxiety, depression, ADHD,  and bipolar 2 disorder  Past Medical History:  Past Medical History:  Diagnosis Date  . Bipolar disorder (HCC)   . Generalized anxiety disorder    Was seen at [facility] for this or [facility] student counseling services. Has been on mediction:  Zoloft 100 mg--doesn't like, Prozac 40 mg  . Testicular torsion     Past Surgical History:  Procedure Laterality Date  . SURGERY SCROTAL / TESTICULAR Bilateral 05/2014   Removed left varicocele and performed bilateral orchiopexy    Family Psychiatric History: Father anxiety and depression. Mother anxiety  Family History:  Family History  Problem Relation Age of Onset  . Obesity Mother   . Other Mother        Prediabetes  . Kidney disease Father   . Diabetes Father   . Obesity Father   . Hyperlipidemia Father   . Depression Father        and Anxiety  . Other Father 7       COVID    Social History:  Social History   Socioeconomic History  . Marital status: Media planner    Spouse name: Albin Felling  . Number of children: 0  . Years of education: college grad sociology  . Highest education level: Not on file  Occupational History  . Occupation: Academic librarian.  Tobacco Use  . Smoking status: Former Smoker    Packs/day: 0.25    Years: 9.00    Pack years: 2.25  Types: Cigarettes    Quit date: 07/16/2018    Years since quitting: 2.0  . Smokeless tobacco: Never Used  Substance and Sexual Activity  . Alcohol use: Yes    Comment: History of too much, but now just occasional.  . Drug use: Yes    Types: Marijuana  . Sexual activity: Yes    Birth control/protection: Condom  Other Topics Concern  . Not on file  Social History Narrative   Currently not working since 12/2019   Lives with long term girlfriend, Albin Felling, who is a high Engineer, site.   Social Determinants of Health   Financial Resource Strain: Not on file  Food Insecurity: Not on file  Transportation Needs: Not on file  Physical Activity: Not on file  Stress: Not  on file  Social Connections: Not on file    Allergies: No Known Allergies  Metabolic Disorder Labs: No results found for: HGBA1C, MPG No results found for: PROLACTIN Lab Results  Component Value Date   CHOL 228 (H) 05/30/2020   TRIG 68 05/30/2020   HDL 109 05/30/2020   LDLCALC 107 (H) 05/30/2020   LDLCALC 70 10/05/2018   No results found for: TSH  Therapeutic Level Labs: No results found for: LITHIUM No results found for: VALPROATE No components found for:  CBMZ  Current Medications: Current Outpatient Medications  Medication Sig Dispense Refill  . terbinafine (LAMISIL) 250 MG tablet Take 1 tablet (250 mg total) by mouth daily. 42 tablet 0  . buPROPion (WELLBUTRIN XL) 300 MG 24 hr tablet Take 1 tablet (300 mg total) by mouth every morning. 30 tablet 2  . lamoTRIgine (LAMICTAL) 100 MG tablet Take 1 tablet (100 mg total) by mouth daily. 30 tablet 2  . traZODone (DESYREL) 100 MG tablet Take 1 tablet (100 mg total) by mouth at bedtime. 30 tablet 2   No current facility-administered medications for this visit.     Musculoskeletal: Strength & Muscle Tone: within normal limits Gait & Station: normal Patient leans: N/A  Psychiatric Specialty Exam: Review of Systems  Blood pressure 132/76, pulse 79, height 6\' 3"  (1.905 m), weight 177 lb (80.3 kg), SpO2 97 %.Body mass index is 22.12 kg/m.  General Appearance: Well Groomed  Eye Contact:  Good  Speech:  Clear and Coherent and Pressured  Volume:  Normal  Mood:  Euthymic  Affect:  Appropriate  Thought Process:  Coherent, Goal Directed and Linear  Orientation:  Full (Time, Place, and Person)  Thought Content: WDL and Logical   Suicidal Thoughts:  No  Homicidal Thoughts:  No  Memory:  Immediate;   Good Recent;   Good Remote;   Good  Judgement:  Fair  Insight:  Good  Psychomotor Activity:  Normal  Concentration:  Concentration: Fair and Attention Span: Fair  Recall:  Good  Fund of Knowledge: Good  Language: Good   Akathisia:  No  Handed:  Right  AIMS (if indicated): Not done  Assets:  Communication Skills Desire for Improvement Housing Intimacy Leisure Time Physical Health Social Support  ADL's:  Intact  Cognition: WNL  Sleep:  Good   Screenings: GAD-7   Flowsheet Row Clinical Support from 06/04/2020 in [facility] Clinical Support from 05/05/2020 in [facility] Office Visit from 04/01/2020 in [facility]  Total GAD-7 Score 8 9 7     PHQ2-9   Flowsheet Row Clinical Support from 06/04/2020 in [facility] Clinical Support from 05/05/2020 in [facility] Office Visit from 04/01/2020 in [facility]  PHQ-2 Total Score 3 2 2   PHQ-9 Total Score 7 10 10        1. Bipolar II disorder (HCC)  Continue- buPROPion (WELLBUTRIN XL) 300 MG 24 hr tablet; Take 1 tablet (300 mg total) by mouth every morning.  Dispense: 30 tablet; Refill: 2 Continue- lamoTRIgine (LAMICTAL) 100 MG tablet; Take 1 tablet (100 mg total) by mouth daily.  Dispense: 30 tablet; Refill: 2 Continue- traZODone (DESYREL) 100 MG tablet; Take 1 tablet (100 mg total) by mouth at bedtime.  Dispense: 30 tablet; Refill: 2  2. Attention deficit hyperactivity disorder (ADHD), predominantly inattentive type  Continue- buPROPion (WELLBUTRIN XL) 300 MG 24 hr tablet; Take 1 tablet (300 mg total) by mouth every morning.  Dispense: 30 tablet; Refill: 2   Follow-up in 3 months Follow-up with therapy  , NP 07/15/2020, 11:16 AM

## 2020-07-30 ENCOUNTER — Ambulatory Visit: Payer: Self-pay | Admitting: Internal Medicine

## 2020-08-05 ENCOUNTER — Telehealth: Payer: Self-pay | Admitting: Internal Medicine

## 2020-08-05 NOTE — Telephone Encounter (Signed)
Allegra (I would get the generic as it works just as well and is cheaper-Fexofenadine) is fine if it works.  If he doesn't feel he is getting better, can try Loratadine (CLaritin), Cetirizine (Zyrtec) of Levocetirizine (Xyzal), all of which are once daily. Different antihistamines work better for different folks--just has to try them out. Also--would purchase nasal fluticasone (or better yet Momatosone (Nasonex) both are corticosteroid nasal sprays and he would alternate nostrils to place 2 sprays each nostril once daily.  The nasal spray has to be used for about 2 weeks before he will notice significant improvement in eyes, nose and throat.  I would do the nasal spray and whichever of the antihistamine that work best for him.  Call if does not improve.

## 2020-08-05 NOTE — Telephone Encounter (Signed)
Spoke with patient and notified of Doctor recommendations. Patient verbally stated his understanding of recommendations.

## 2020-08-05 NOTE — Telephone Encounter (Signed)
Patient called asking for a recommendation on what to take for his seasonal allergies. Patient stated that he is having a scratchy throat, congestion, eye irritation; he also mentioned that he bought Allegra, but wants to make sure that it will not intervene with his other meds. Please advise.

## 2020-10-02 ENCOUNTER — Encounter (HOSPITAL_COMMUNITY): Payer: No Payment, Other | Admitting: Psychiatry

## 2020-10-28 ENCOUNTER — Other Ambulatory Visit (HOSPITAL_COMMUNITY): Payer: Self-pay | Admitting: Psychiatry

## 2020-10-28 DIAGNOSIS — F3181 Bipolar II disorder: Secondary | ICD-10-CM

## 2020-11-20 ENCOUNTER — Encounter (HOSPITAL_COMMUNITY): Payer: Self-pay | Admitting: Psychiatry

## 2020-11-20 ENCOUNTER — Other Ambulatory Visit: Payer: Self-pay

## 2020-11-20 ENCOUNTER — Ambulatory Visit (INDEPENDENT_AMBULATORY_CARE_PROVIDER_SITE_OTHER): Payer: No Payment, Other | Admitting: Psychiatry

## 2020-11-20 DIAGNOSIS — F900 Attention-deficit hyperactivity disorder, predominantly inattentive type: Secondary | ICD-10-CM | POA: Diagnosis not present

## 2020-11-20 DIAGNOSIS — F3181 Bipolar II disorder: Secondary | ICD-10-CM | POA: Diagnosis not present

## 2020-11-20 MED ORDER — LAMOTRIGINE 25 MG PO TABS: 50.0000 mg | ORAL_TABLET | Freq: Every day | ORAL | 2 refills | Status: DC

## 2020-11-20 MED ORDER — TRAZODONE HCL 100 MG PO TABS: 100.0000 mg | ORAL_TABLET | Freq: Every day | ORAL | 2 refills | Status: DC

## 2020-11-20 MED ORDER — BUPROPION HCL ER (XL) 300 MG PO TB24: 300.0000 mg | ORAL_TABLET | ORAL | 2 refills | Status: DC

## 2020-11-20 NOTE — Progress Notes (Signed)
BH MD/PA/NP OP Progress Note  11/20/2020 3:15 PM Eric Townsend  MRN:  [id number]  Chief Complaint: "I havent tried had the lamictal in a month"    HPI: 30 year old male seen today for follow up psychiatric evaluation.  He psychiatric history of anxiety, depression, ADHD, and bipolar 2 disorder.  He is currently managed on Lamictal 100 daily, trazodone 100 mg nightly, and Wellbutrin 300 mg every morning. He informed provider that he has not had Lamictal in over a month because he ran out.  He notes that his other medications are somewhat effective in managing his psychiatric conditions.    Today he is well-groomed, pleasant, cooperative,and engaged in conversation.  Patient notes that since discontinuing Lamictal he has been more distractible, irritable, and having racing thoughts.  He denies other symptoms of mania.  Patient notes that he is also depressed most days due to not being able to find a position.  He notes that he wants to work in Personnel officer and make a substantial living to support himself and his girlfriend.  He informed Clinical research associate that he did speak to the care management team and talked about potentially working at [employer].  He however notes that he does not want to travel as he ikes being near his mother and his girlfriend.  He notes that he is interviewed with several other places however notes that they have not hired him.  He notes that he is waiting to hear back from [place] its been over a year since he is found a position.    Patient notes that the above exacerbates his anxiety and depression although he notes that overall his anxiety and depression are well managed.  Provider conducted a GAD-7 and he scored a 9.  Provider also conducted a PHQ 9 and patient scored a 10.  He endorses adequate sleep and appetite.  Today he denies SI/HI/VAH or paranoia.     Patient is agreeable to starting Lamictal 50 mg for 2 weeks and then increasing to 100 mg next 2 weeks. Potential side effects of  medication and risks vs benefits of treatment vs non-treatment were explained and discussed. All questions were answered.  He will continue all other medications as prescribed and follow up with counseling at [facility] counseling.  No other concerns noted at this time.   Visit Diagnosis:    ICD-10-CM   1. Bipolar II disorder (HCC)  F31.81 lamoTRIgine (LAMICTAL) 25 MG tablet    traZODone (DESYREL) 100 MG tablet    buPROPion (WELLBUTRIN XL) 300 MG 24 hr tablet    2. Attention deficit hyperactivity disorder (ADHD), predominantly inattentive type  F90.0 buPROPion (WELLBUTRIN XL) 300 MG 24 hr tablet      Past Psychiatric History:  anxiety, depression, ADHD, and bipolar 2 disorder  Past Medical History:  Past Medical History:  Diagnosis Date   Bipolar disorder (HCC)    Generalized anxiety disorder    Was seen at [facility] for this or [employer] student counseling services. Has been on mediction:  Zoloft 100 mg--doesn't like, Prozac 40 mg   Testicular torsion     Past Surgical History:  Procedure Laterality Date   SURGERY SCROTAL / TESTICULAR Bilateral 05/2014   Removed left varicocele and performed bilateral orchiopexy    Family Psychiatric History: Father anxiety and depression. Mother anxiety  Family History:  Family History  Problem Relation Age of Onset   Obesity Mother    Other Mother        Prediabetes   Kidney disease Father  Diabetes Father    Obesity Father    Hyperlipidemia Father    Depression Father        and Anxiety   Other Father 60       COVID    Social History:  Social History   Socioeconomic History   Marital status: Media planner    Spouse name: Eric Townsend   Number of children: 0   Years of education: college grad sociology   Highest education level: Not on file  Occupational History   Occupation: Academic librarian.  Tobacco Use   Smoking status: Former    Packs/day: 0.25    Years: 9.00    Pack years: 2.25    Types: Cigarettes    Quit date: 07/16/2018     Years since quitting: 2.3   Smokeless tobacco: Never  Substance and Sexual Activity   Alcohol use: Yes    Comment: History of too much, but now just occasional.   Drug use: Yes    Types: Marijuana   Sexual activity: Yes    Birth control/protection: Condom  Other Topics Concern   Not on file  Social History Narrative   Currently not working since 12/2019   Lives with long term girlfriend, Eric Townsend, who is a high Engineer, site.   Social Determinants of Health   Financial Resource Strain: Not on file  Food Insecurity: Not on file  Transportation Needs: Not on file  Physical Activity: Not on file  Stress: Not on file  Social Connections: Not on file    Allergies: Not on File  Metabolic Disorder Labs: No results found for: HGBA1C, MPG No results found for: PROLACTIN Lab Results  Component Value Date   CHOL 228 (H) 05/30/2020   TRIG 68 05/30/2020   HDL 109 05/30/2020   LDLCALC 107 (H) 05/30/2020   LDLCALC 70 10/05/2018   No results found for: TSH  Therapeutic Level Labs: No results found for: LITHIUM No results found for: VALPROATE No components found for:  CBMZ  Current Medications: Current Outpatient Medications  Medication Sig Dispense Refill   terbinafine (LAMISIL) 250 MG tablet Take 1 tablet (250 mg total) by mouth daily. 42 tablet 0   buPROPion (WELLBUTRIN XL) 300 MG 24 hr tablet Take 1 tablet (300 mg total) by mouth every morning. 30 tablet 2   lamoTRIgine (LAMICTAL) 25 MG tablet Take 2 tablets (50 mg total) by mouth daily. 120 tablet 2   traZODone (DESYREL) 100 MG tablet Take 1 tablet (100 mg total) by mouth at bedtime. 30 tablet 2   No current facility-administered medications for this visit.     Musculoskeletal: Strength & Muscle Tone: within normal limits Gait & Station: normal Patient leans: N/A  Psychiatric Specialty Exam: Review of Systems  Blood pressure 121/71, pulse 72, height 6\' 3"  (1.905 m), weight 178 lb (80.7 kg).Body mass index is 22.25  kg/m.  General Appearance: Well Groomed  Eye Contact:  Good  Speech:  Clear and Coherent and Pressured  Volume:  Normal  Mood:  Euthymic and she becomes anxious and depressed when thinking about finding a job  Affect:  Appropriate  Thought Process:  Coherent, Goal Directed and Linear  Orientation:  Full (Time, Place, and Person)  Thought Content: WDL and Logical   Suicidal Thoughts:  No  Homicidal Thoughts:  No  Memory:  Immediate;   Good Recent;   Good Remote;   Good  Judgement:  Good  Insight:  Good  Psychomotor Activity:  Normal  Concentration:  Concentration:  Fair and Attention Span: Fair  Recall:  Good  Fund of Knowledge: Good  Language: Good  Akathisia:  No  Handed:  Right  AIMS (if indicated): Not done  Assets:  Communication Skills Desire for Improvement Housing Intimacy Leisure Time Physical Health Social Support  ADL's:  Intact  Cognition: WNL  Sleep:  Good   Screenings: GAD-7    Flowsheet Row Clinical Support from 11/20/2020 in [facility] Clinical Support from 06/04/2020 in [facility] Clinical Support from 05/05/2020 in [facility] Office Visit from 04/01/2020 in [facility]  Total GAD-7 Score 9 8 9 7       PHQ2-9    Flowsheet Row Clinical Support from 11/20/2020 in [facility] Clinical Support from 06/04/2020 in [facility] Clinical Support from 05/05/2020 in [facility] Office Visit from 04/01/2020 in [facility]  PHQ-2 Total Score 1 3 2 2   PHQ-9 Total Score 10 7 10 10       Flowsheet Row Clinical Support from 11/20/2020 in [facility]  C-SSRS RISK CATEGORY Error: Q7 should not be populated when Q6 is No      Assessment and Plan: Patient informed writer that he becomes anxious and depressed when  thinking about not having a job.  He notes that its been over a year since he has worked.  He also notes that since being off Lamictal he is been more distracted, irritable, and having racing thoughts.  Today he is agreeable to restarting Lamictal 50 mg for 2 weeks and then increasing to 100 mg for 2 weeks help stabilize mood.  He will continue all other medication as scribed.  1. Bipolar II disorder (HCC)  Restart- lamoTRIgine (LAMICTAL) 25 MG tablet; Take 2 tablets (50 mg total) by mouth daily.  Dispense: 120 tablet; Refill: 2 Continue- traZODone (DESYREL) 100 MG tablet; Take 1 tablet (100 mg total) by mouth at bedtime.  Dispense: 30 tablet; Refill: 2 Continue- buPROPion (WELLBUTRIN XL) 300 MG 24 hr tablet; Take 1 tablet (300 mg total) by mouth every morning.  Dispense: 30 tablet; Refill: 2  2. Attention deficit hyperactivity disorder (ADHD), predominantly inattentive type  Continue- buPROPion (WELLBUTRIN XL) 300 MG 24 hr tablet; Take 1 tablet (300 mg total) by mouth every morning.  Dispense: 30 tablet; Refill: 2   Follow-up in 3 months Follow-up with therapy  , NP 11/20/2020, 3:15 PM

## 2020-12-30 ENCOUNTER — Ambulatory Visit: Payer: Self-pay | Admitting: Internal Medicine

## 2021-02-18 ENCOUNTER — Other Ambulatory Visit: Payer: Self-pay

## 2021-02-18 ENCOUNTER — Ambulatory Visit (INDEPENDENT_AMBULATORY_CARE_PROVIDER_SITE_OTHER): Payer: No Payment, Other | Admitting: Psychiatry

## 2021-02-18 ENCOUNTER — Encounter (HOSPITAL_COMMUNITY): Payer: Self-pay | Admitting: Psychiatry

## 2021-02-18 DIAGNOSIS — F900 Attention-deficit hyperactivity disorder, predominantly inattentive type: Secondary | ICD-10-CM | POA: Diagnosis not present

## 2021-02-18 DIAGNOSIS — F3181 Bipolar II disorder: Secondary | ICD-10-CM

## 2021-02-18 MED ORDER — BUPROPION HCL ER (XL) 300 MG PO TB24: 300.0000 mg | ORAL_TABLET | ORAL | 3 refills | Status: DC

## 2021-02-18 MED ORDER — LAMOTRIGINE 100 MG PO TABS: 100.0000 mg | ORAL_TABLET | Freq: Every day | ORAL | 3 refills | Status: DC

## 2021-02-18 MED ORDER — TRAZODONE HCL 100 MG PO TABS: 100.0000 mg | ORAL_TABLET | Freq: Every day | ORAL | 3 refills | Status: DC
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
## 2021-02-19 NOTE — Addendum Note (Signed)
Addended by: Shanna Cisco on: 02/19/2021 11:09 AM   Modules accepted: Level of Service

## 2021-03-05 ENCOUNTER — Ambulatory Visit (INDEPENDENT_AMBULATORY_CARE_PROVIDER_SITE_OTHER): Payer: Self-pay | Admitting: Internal Medicine

## 2021-03-05 ENCOUNTER — Encounter: Payer: Self-pay | Admitting: Internal Medicine

## 2021-03-05 ENCOUNTER — Other Ambulatory Visit: Payer: Self-pay

## 2021-03-05 VITALS — BP 120/70 | HR 80 | Resp 16 | Ht 74.0 in | Wt 166.0 lb

## 2021-03-05 DIAGNOSIS — Z23 Encounter for immunization: Secondary | ICD-10-CM

## 2021-03-05 DIAGNOSIS — G8929 Other chronic pain: Secondary | ICD-10-CM

## 2021-03-05 DIAGNOSIS — M546 Pain in thoracic spine: Secondary | ICD-10-CM

## 2021-03-05 DIAGNOSIS — R197 Diarrhea, unspecified: Secondary | ICD-10-CM

## 2021-03-05 DIAGNOSIS — M25512 Pain in left shoulder: Secondary | ICD-10-CM

## 2021-03-05 DIAGNOSIS — R238 Other skin changes: Secondary | ICD-10-CM

## 2021-03-05 DIAGNOSIS — M542 Cervicalgia: Secondary | ICD-10-CM

## 2021-03-05 NOTE — Progress Notes (Signed)
Subjective:    Patient ID: Eric Townsend, male   DOB: 1990-10-01, 30 y.o.   MRN: [id number]   HPI   Inflammatory changes of scalp:  Jan 2022 was his second treatment --first was in 10/2018.  Did perform skin scrapings negative for fungus, but did seem to respond to Terbinafine.  Unfortunately, was lost to follow up at one point and then returned again in January of 2022 and had a nickel sized area with inflammation/perifollicular inflammation at right crown for which he received the second round of Terbinafine without follow up as above.   He states he feels this is better than back in 2020, but still there.    2.  BMs often loose.  Feels he has lactose intolerance, though has issues even without dairy (now very limited).  Has been a problem for 2-3 years.  In a week's time, has 3 loose stools.  Has noted this problem when at a higher stress level, though always stressed.   No definite bloating.   No issues with flatulence or belching.  No melena or hematochezia. No definite nausea and no vomiting.   Describes abdominal pressure, which is uncomfortable, maybe like cramping.  Relieved after has 2-3 stools.   Has lost 13 lbs from January, but changed his diet significantly since working at [employer] in June and has lost the weight.  Not eating the junk food--particularly sugary foods/desserts prior.  Cut back on pork and red meat as well.     Other than last week, when he had to leave work early due to frequent stooling (all loose), he feels his abdominal and stool concerns have improved with change in diet.    3.  Lot of tension and pain in upper back and shoulders:  Would like to get into PT to see if can improve.  Also, with history of Left AC joint injury when moving a piece of heavy furniture back in 2018.  Was told by [facility] pro bono PT clinic that likely an [facility] joint injury.  Continues to have some discomfort.       Current Meds  Medication Sig   buPROPion  (WELLBUTRIN XL) 300 MG 24 hr tablet Take 1 tablet (300 mg total) by mouth every morning.   lamoTRIgine (LAMICTAL) 100 MG tablet Take 1 tablet (100 mg total) by mouth daily.   traZODone (DESYREL) 100 MG tablet Take 1 tablet (100 mg total) by mouth at bedtime.   Not on File   Review of Systems    Objective:   BP 120/70 (BP Location: Left Arm, Patient Position: Sitting, Cuff Size: Normal)   Pulse 80   Resp 16   Ht 6\' 2"  (1.88 m)   Wt 166 lb (75.3 kg)   BMI 21.31 kg/m   Physical Exam NAD HEENT:  Inflammation, much of which appears to be perifollicular, involving most of right side of scalp.  Appears to have decreased hair density on right as well. Neck:  Supple, No adenopathy Chest:  CTA CV:  RRR without murmur or rub. Radial pulses normal and equal Abd:  S, NT, No HSM or mass, + BS.  Stretch marks in loose skin.  MS:  Palpable, tender muscle spasm of left thoracic back extending to left cervical paraspinous area.  Mild tenderness over left AC joint.  Does not seem more prominent than right.  No obvious laxity of joint with downward stress of humerus.   Assessment & Plan    Scalp inflammation:  have not had follow up adequately to see if Terbinafine indeed improves the inflammation and fungal culture from scalp scrapings without growth in past.  Referral to Dermatology for another look.  2.  Chronic loose stools:  CBC, CMP, Sed Rate Tissue transglutaminase.  GI referral.    3.  Left back muscle spasm and perhaps AC joint chronic injury:  PT referral.    4.  HM:  Pfizer Bivalent Covid booster.

## 2021-03-08 LAB — CBC WITH DIFFERENTIAL/PLATELET
Basophils Absolute: 0 10*3/uL (ref 0.0–0.2)
Basos: 0 %
EOS (ABSOLUTE): 0.1 10*3/uL (ref 0.0–0.4)
Eos: 1 %
Hematocrit: 49.3 % (ref 37.5–51.0)
Hemoglobin: 16.4 g/dL (ref 13.0–17.7)
Immature Grans (Abs): 0 10*3/uL (ref 0.0–0.1)
Immature Granulocytes: 0 %
Lymphocytes Absolute: 2.6 10*3/uL (ref 0.7–3.1)
Lymphs: 49 %
MCH: 28.7 pg (ref 26.6–33.0)
MCHC: 33.3 g/dL (ref 31.5–35.7)
MCV: 86 fL (ref 79–97)
Monocytes Absolute: 0.5 10*3/uL (ref 0.1–0.9)
Monocytes: 9 %
Neutrophils Absolute: 2.2 10*3/uL (ref 1.4–7.0)
Neutrophils: 41 %
Platelets: 268 10*3/uL (ref 150–450)
RBC: 5.71 x10E6/uL (ref 4.14–5.80)
RDW: 12.4 % (ref 11.6–15.4)
WBC: 5.5 10*3/uL (ref 3.4–10.8)

## 2021-03-08 LAB — COMPREHENSIVE METABOLIC PANEL WITH GFR
ALT: 17 IU/L (ref 0–44)
AST: 21 IU/L (ref 0–40)
Albumin/Globulin Ratio: 1.9 (ref 1.2–2.2)
Albumin: 4.9 g/dL (ref 4.0–5.0)
Alkaline Phosphatase: 85 IU/L (ref 44–121)
BUN/Creatinine Ratio: 10 (ref 9–20)
BUN: 11 mg/dL (ref 6–20)
Bilirubin Total: 0.3 mg/dL (ref 0.0–1.2)
CO2: 23 mmol/L (ref 20–29)
Calcium: 9.7 mg/dL (ref 8.7–10.2)
Chloride: 104 mmol/L (ref 96–106)
Creatinine, Ser: 1.06 mg/dL (ref 0.76–1.27)
Globulin, Total: 2.6 g/dL (ref 1.5–4.5)
Glucose: 96 mg/dL (ref 70–99)
Potassium: 4.4 mmol/L (ref 3.5–5.2)
Sodium: 142 mmol/L (ref 134–144)
Total Protein: 7.5 g/dL (ref 6.0–8.5)
eGFR: 96 mL/min/1.73

## 2021-03-08 LAB — TISSUE TRANSGLUTAMINASE ABS,IGG,IGA
t-Transglutaminase (tTG) IgA: <2 U/mL (ref 0–3)
t-Transglutaminase (tTG) IgG: <2 U/mL (ref 0–5)

## 2021-03-08 LAB — SEDIMENTATION RATE: Sed Rate: 4 mm/h (ref 0–15)

## 2021-03-10 DIAGNOSIS — R197 Diarrhea, unspecified: Secondary | ICD-10-CM | POA: Insufficient documentation

## 2021-03-10 DIAGNOSIS — M542 Cervicalgia: Secondary | ICD-10-CM | POA: Insufficient documentation

## 2021-03-10 DIAGNOSIS — R238 Other skin changes: Secondary | ICD-10-CM | POA: Insufficient documentation

## 2021-03-10 DIAGNOSIS — G8929 Other chronic pain: Secondary | ICD-10-CM | POA: Insufficient documentation

## 2021-03-11 ENCOUNTER — Encounter: Payer: Self-pay | Admitting: Physician Assistant

## 2021-03-16 ENCOUNTER — Telehealth: Payer: Self-pay | Admitting: Dermatology

## 2021-03-16 NOTE — Telephone Encounter (Signed)
Notes documented and referral routed back to referring office. 

## 2021-03-16 NOTE — Telephone Encounter (Signed)
Patient is calling for a referral appointment from Julieanne Manson, M.D.  Patient does not want to wait until April 2023 for an appointment so would like referral sent back to Dr. Renne Crigler office.

## 2021-03-25 ENCOUNTER — Ambulatory Visit: Payer: 59 | Attending: Internal Medicine

## 2021-03-25 ENCOUNTER — Other Ambulatory Visit: Payer: Self-pay

## 2021-03-25 DIAGNOSIS — M542 Cervicalgia: Secondary | ICD-10-CM | POA: Diagnosis present

## 2021-03-25 DIAGNOSIS — R293 Abnormal posture: Secondary | ICD-10-CM | POA: Diagnosis present

## 2021-03-25 DIAGNOSIS — R29898 Other symptoms and signs involving the musculoskeletal system: Secondary | ICD-10-CM | POA: Diagnosis present

## 2021-03-25 DIAGNOSIS — M25512 Pain in left shoulder: Secondary | ICD-10-CM | POA: Diagnosis present

## 2021-03-25 DIAGNOSIS — M546 Pain in thoracic spine: Secondary | ICD-10-CM | POA: Diagnosis present

## 2021-03-25 DIAGNOSIS — G8929 Other chronic pain: Secondary | ICD-10-CM | POA: Diagnosis present

## 2021-03-26 NOTE — Therapy (Signed)
[facility] [address] [place], [place], [address] Phone: [phone]   Fax:  [phone]  Physical Therapy Evaluation  Patient Details  Name: Eric Townsend MRN: [id number] Date of Birth: Apr 27, 1991 Referring Provider (PT): Julieanne Manson, MD   Encounter Date: 03/25/2021   PT End of Session - 03/26/21 0839     Visit Number 1    Number of Visits 7    Date for PT Re-Evaluation 05/14/21    Authorization Type [employer]    PT Start Time 1016    PT Stop Time 1105    PT Time Calculation (min) 49 min    Activity Tolerance Patient tolerated treatment well    Behavior During Therapy [facility] for tasks assessed/performed             Past Medical History:  Diagnosis Date   Bipolar disorder (HCC)    Generalized anxiety disorder    Was seen at [facility] for this or [employer] student counseling services. Has been on mediction:  Zoloft 100 mg--doesn't like, Prozac 40 mg   Testicular torsion     Past Surgical History:  Procedure Laterality Date   SURGERY SCROTAL / TESTICULAR Bilateral 05/2014   Removed left varicocele and performed bilateral orchiopexy    There were no vitals filed for this visit.    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 Received PT in 2020 and 4 months ago which was helpful. Currently, having neck and mid back spasms, and lateral L GH jt area pain. Sleeping is uncomfortable.    Patient Stated Goals To have exercises to be able to manage the pai better. To complete in a few sessions if possible.    Currently in Pain? Yes    Pain Score 5    3-8 pain range   Pain Location Neck   upper shoulder, mid back   Pain Orientation Left    Pain Descriptors / Indicators Aching;Burning;Tightness    Pain Type Chronic pain    Pain Radiating Towards lateral deltoid area     Pain Onset More than a month ago    Aggravating Factors  work- wasing dishes, disc golf, sleeping    Pain Relieving Factors Some stretches he remembers    Multiple Pain Sites No                [facility] PT Assessment - 03/26/21 0001       Assessment   Medical Diagnosis Chronic left-sided thoracic back pain. Neck pain on left side. Pain in left acromioclavicular joint    Referring Provider (PT) Julieanne Manson, MD    Onset Date/Surgical Date --   2018   Hand Dominance Right    Prior Therapy Yes      Precautions   Precautions None      Restrictions   Weight Bearing Restrictions No      Balance Screen   Has the patient fallen in the past 6 months No    Has the patient had a decrease in activity level because of a fear of falling?  No    Is the patient reluctant to leave their home because of a fear of falling?  No      Home Environment   Additional Comments No issue with accessing home or negotiating within home      Prior Function  Level of Independence Independent    Vocation Part time employment    Financial planner- cooking and serving      Cognition   Overall Cognitive Status Within Functional Limits for tasks assessed      Observation/Other Assessments   Observations tight pectorals    Focus on Therapeutic Outcomes (FOTO)  60% functional ability      Sensation   Light Touch Appears Intact      Posture/Postural Control   Posture/Postural Control Postural limitations    Postural Limitations Rounded Shoulders;Forward head   winging scapulas     ROM / Strength   AROM / PROM / Strength AROM;Strength      AROM   AROM Assessment Site Cervical    Cervical Flexion 40, posterior pulling    Cervical Extension 55, mid back strain    Cervical - Right Side Bend 30    Cervical - Left Side Bend 30, L neck pulling    Cervical - Right Rotation 60    Cervical - Left Rotation 50, L neck pulling      Strength   Overall Strength Comments UE myotomal  screen      Palpation   Palpation comment TTP L upper trap, levator, lat dorsi, and AC jt without separation sprain noted      Special Tests    Special Tests Rotator Cuff Impingement;Cervical    Cervical Tests Spurling's    Rotator Cuff Impingment tests Leanord Asal test;Empty Can test;Full Can test      Spurling's   Findings Negative    Side Left   right     Hawkins-Kennedy test   Findings Negative    Side Left      Empty Can test   Findings Negative    Side Left      Full Can test   Findings Negative    Side Left                        Objective measurements completed on examination: See above findings.                PT Education - 03/26/21 0838     Education Details Eval findings, POC, HEP, sleeping positions and support for comfort    Person(s) Educated Patient    Methods Demonstration;Tactile cues;Explanation;Verbal cues;Handout    Comprehension Verbalized understanding;Returned demonstration;Verbal cues required;Tactile cues required;Need further instruction              PT Short Term Goals - 03/26/21 1329       PT SHORT TERM GOAL #1   Title STGs=LTGs               PT Long Term Goals - 03/26/21 1330       PT LONG TERM GOAL #1   Title Pt will be Ind in a HEP to maintain achieved LOF.    Status New    Target Date 05/14/21      PT LONG TERM GOAL #2   Title Pt will voice understanding of measures to assist in the reduction of lower cervical, upper shoulder and mid back pain.    Status New    Target Date 05/14/21      PT LONG TERM GOAL #3   Title Increase pt cervical RON for L and R sidebending to 40d and L rotation to 60d for improved mobility and safety with driving and overall improved neck and UB function    Baseline see  flowsheet    Status New    Target Date 05/14/21      PT LONG TERM GOAL #4   Title Pt's L lower cervica, upper shoulder and L mid back pain wil decrease to a range of 2-5/10 for impved  neckand UB function and QOL.    Baseline 3-8/10    Status New    Target Date 05/14/21                    Plan - 03/26/21 0034     Clinical Impression Statement Pt presents with chronic lower neck, upper sholder and mid back pain. Eval revealed postural deficits with rounded shoulders, winging scapula, and tight pectorals and associated cervical, upper shoulder, and mid back muscle tightness and tenderness. See Education and Instructions for HEP ther ex and recommendations for sleeping. Pt will benefit from skilled PT to address deficits, decrease pain, and optimize neck and UB function.    Personal Factors and Comorbidities Time since onset of injury/illness/exacerbation;Comorbidity 2;Comorbidity 1;Past/Current Experience    Comorbidities bipolar, anxiety    Stability/Clinical Decision Making Stable/Uncomplicated    Clinical Decision Making Low    Rehab Potential Good    PT Frequency 1x / week    PT Duration 6 weeks    PT Treatment/Interventions ADLs/Self Care Home Management;Cryotherapy;Electrical Stimulation;Iontophoresis 4mg /ml Dexamethasone;Moist Heat;Traction;Ultrasound;Therapeutic exercise;Therapeutic activities;Patient/family education;Manual techniques;Passive range of motion;Taping;Dry needling;Spinal Manipulations    PT Next Visit Plan Assess response to HEP, progress ther ex as indicated, dry needling as indicated    PT Home Exercise Plan [email or web address]    Consulted and Agree with Plan of Care Patient             Patient will benefit from skilled therapeutic intervention in order to improve the following deficits and impairments:  Decreased range of motion, Pain, Impaired UE functional use, Decreased activity tolerance, Postural dysfunction, Decreased strength  Visit Diagnosis: Neck pain on left side  Arthralgia of left acromioclavicular joint  Chronic left-sided thoracic back pain  Abnormal posture  Decreased ROM of neck     Problem List Patient Active  Problem List   Diagnosis Date Noted   Neck pain on left side 03/10/2021   Chronic left-sided thoracic back pain 03/10/2021   Diarrhea 03/10/2021   Scalp irritation 03/10/2021   Bipolar disorder (HCC)    Attention deficit hyperactivity disorder (ADHD), predominantly inattentive type 04/01/2020   Tinea capitis 11/10/2018   Raynaud's phenomenon without gangrene 08/08/2018   Pain in left acromioclavicular joint 08/08/2018   History of testicular mass 08/08/2018   Bipolar II disorder (HCC) 08/08/2018   Generalized anxiety disorder     08/10/2018 MS, PT 03/26/21 5:40 PM   [facility] [address] [place], [place], [place] Phone: [phone]   Fax:  [phone]  Name: LENELL LAMA MRN: Glennon Mac Date of Birth: 1990/07/06

## 2021-03-31 ENCOUNTER — Encounter: Payer: Self-pay | Admitting: Physician Assistant

## 2021-03-31 ENCOUNTER — Ambulatory Visit (INDEPENDENT_AMBULATORY_CARE_PROVIDER_SITE_OTHER): Payer: 59 | Admitting: Physician Assistant

## 2021-03-31 VITALS — BP 90/64 | HR 76 | Ht 74.0 in | Wt 166.0 lb

## 2021-03-31 DIAGNOSIS — R1032 Left lower quadrant pain: Secondary | ICD-10-CM

## 2021-03-31 DIAGNOSIS — R195 Other fecal abnormalities: Secondary | ICD-10-CM | POA: Diagnosis not present

## 2021-03-31 DIAGNOSIS — R1031 Right lower quadrant pain: Secondary | ICD-10-CM | POA: Diagnosis not present

## 2021-03-31 NOTE — Patient Instructions (Signed)
Low-FODMAP diet handout provided.   If you are age 32 or older, your body mass index should be between 23-30. Your Body mass index is 21.31 kg/m. If this is out of the aforementioned range listed, please consider follow up with your Primary Care Provider.  If you are age 4 or younger, your body mass index should be between 19-25. Your Body mass index is 21.31 kg/m. If this is out of the aformentioned range listed, please consider follow up with your Primary Care Provider.   ________________________________________________________  The [facility] providers would like to encourage you to use [facility] to communicate with providers for non-urgent requests or questions.  Due to long hold times on the telephone, sending your provider a message by [facility] may be a faster and more efficient way to get a response.  Please allow 48 business hours for a response.  Please remember that this is for non-urgent requests.  _______________________________________________________

## 2021-03-31 NOTE — Progress Notes (Signed)
Chief Complaint: Loose stools  HPI:    Eric Townsend is a 30 year old male with a past medical history as listed below including generalized anxiety disorder and bipolar disorder, who was referred to me by Mack Hook, MD for a complaint of loose stools.    03/05/2021 CBC, CMP, ESR and transglutaminase IgA were all normal.    Today, the patient presents to clinic and tells me that he has always known that he is lactose intolerant, so he tries to avoid this as well as Lactaid when he has dairy, but regardless of this continues with diarrhea and some lower abdominal discomfort about 3 to 4 days out of the week where he will have 5-6 stools that day.  Tells me "I just do not want to spend that much time in the bathroom".  Describes that on the other days he will oftentimes have a formed solid stool.  Does describe a lower abdominal discomfort prior to a bowel movement which is sometimes better afterwards.  He feels like something he is eating is "irritating" his symptoms.  "Maybe I have an allergy".  Has not been able to pinpoint any other foods that give him trouble.  Does have occasional nausea.  Tells me if he starts with a day of diarrhea and has to leave the house he will use an Imodium which works well for him.     Denies fever, chills, weight loss, heartburn, reflux, blood in his stool, family history of IBD or colon cancer.     Past Medical History:  Diagnosis Date   Bipolar disorder (B and E)    Generalized anxiety disorder    Was seen at [facility] for this or [employer] student counseling services. Has been on mediction:  Zoloft 100 mg--doesn't like, Prozac 40 mg   Testicular torsion     Past Surgical History:  Procedure Laterality Date   SURGERY SCROTAL / TESTICULAR Bilateral 05/2014   Removed left varicocele and performed bilateral orchiopexy    Current Outpatient Medications  Medication Sig Dispense Refill   buPROPion (WELLBUTRIN XL) 300 MG 24 hr tablet Take 1 tablet (300 mg total) by  mouth every morning. 30 tablet 3   lamoTRIgine (LAMICTAL) 100 MG tablet Take 1 tablet (100 mg total) by mouth daily. 30 tablet 3   terbinafine (LAMISIL) 250 MG tablet Take 1 tablet (250 mg total) by mouth daily. (Patient not taking: Reported on 03/05/2021) 42 tablet 0   traZODone (DESYREL) 100 MG tablet Take 1 tablet (100 mg total) by mouth at bedtime. 30 tablet 3   No current facility-administered medications for this visit.    Allergies as of 03/31/2021   (Not on File)    Family History  Problem Relation Age of Onset   Obesity Mother    Other Mother        Prediabetes   Kidney disease Father    Diabetes Father    Obesity Father    Hyperlipidemia Father    Depression Father        and Anxiety   Other Father 23       COVID    Social History   Socioeconomic History   Marital status: Soil scientist    Spouse name: Eric Townsend   Number of children: 0   Years of education: college grad sociology   Highest education level: Not on file  Occupational History   Occupation: Actuary.  Tobacco Use   Smoking status: Former    Packs/day: 0.25    Years:  9.00    Pack years: 2.25    Types: Cigarettes    Quit date: 07/16/2018    Years since quitting: 2.7   Smokeless tobacco: Never  Substance and Sexual Activity   Alcohol use: Yes    Comment: History of too much, but now just occasional.   Drug use: Yes    Types: Marijuana   Sexual activity: Yes    Birth control/protection: Condom  Other Topics Concern   Not on file  Social History Narrative   Currently not working since 12/2019   Lives with long term girlfriend, Eric Townsend, who is a high Education officer, museum.   Social Determinants of Health   Financial Resource Strain: Not on file  Food Insecurity: Not on file  Transportation Needs: Not on file  Physical Activity: Not on file  Stress: Not on file  Social Connections: Not on file  Intimate Partner Violence: Not on file    Review of Systems:    Constitutional: No weight  loss, fever or chills Skin: No rash Cardiovascular: No chest pain  Respiratory: No SOB  Gastrointestinal: See HPI and otherwise negative Genitourinary: No dysuria  Neurological: No headache, dizziness or syncope Musculoskeletal: No new muscle or joint pain Hematologic: No bleeding  Psychiatric: +anxiety   Physical Exam:  Vital signs: BP 90/64   Pulse 76   Ht $R'6\' 2"'Ex$  (1.88 m)   Wt 166 lb (75.3 kg)   BMI 21.31 kg/m    Constitutional:   Pleasant AA male appears to be in NAD, Well developed, Well nourished, alert and cooperative Head:  Normocephalic and atraumatic. Eyes:   PEERL, EOMI. No icterus. Conjunctiva pink. Ears:  Normal auditory acuity. Neck:  Supple Throat: Oral cavity and pharynx without inflammation, swelling or lesion.  Respiratory: Respirations even and unlabored. Lungs clear to auscultation bilaterally.   No wheezes, crackles, or rhonchi.  Cardiovascular: Normal S1, S2. No MRG. Regular rate and rhythm. No peripheral edema, cyanosis or pallor.  Gastrointestinal:  Soft, nondistended, nontender. No rebound or guarding. Normal bowel sounds. No appreciable masses or hepatomegaly. Rectal:  Not performed.  Msk:  Symmetrical without gross deformities. Without edema, no deformity or joint abnormality.  Neurologic:  Alert and  oriented x4;  grossly normal neurologically.  Skin:   Dry and intact without significant lesions or rashes. Psychiatric: Demonstrates good judgement and reason without abnormal affect or behaviors.  RELEVANT LABS AND IMAGING: CBC    Component Value Date/Time   WBC 5.5 03/05/2021 1613   WBC 10.6 05/29/2014 0730   RBC 5.71 03/05/2021 1613   RBC 5.28 05/29/2014 0730   HGB 16.4 03/05/2021 1613   HCT 49.3 03/05/2021 1613   PLT 268 03/05/2021 1613   MCV 86 03/05/2021 1613   MCV 88 05/29/2014 0730   MCH 28.7 03/05/2021 1613   MCH 27.8 05/29/2014 0730   MCHC 33.3 03/05/2021 1613   MCHC 31.5 (L) 05/29/2014 0730   RDW 12.4 03/05/2021 1613   RDW 13.5  05/29/2014 0730   LYMPHSABS 2.6 03/05/2021 1613   EOSABS 0.1 03/05/2021 1613   BASOSABS 0.0 03/05/2021 1613    CMP     Component Value Date/Time   NA 142 03/05/2021 1613   NA 141 05/29/2014 0730   K 4.4 03/05/2021 1613   K 3.9 05/29/2014 0730   CL 104 03/05/2021 1613   CL 106 05/29/2014 0730   CO2 23 03/05/2021 1613   CO2 28 05/29/2014 0730   GLUCOSE 96 03/05/2021 1613   GLUCOSE 90 05/29/2014 0730  BUN 11 03/05/2021 1613   BUN 10 05/29/2014 0730   CREATININE 1.06 03/05/2021 1613   CREATININE 0.97 05/29/2014 0730   CALCIUM 9.7 03/05/2021 1613   CALCIUM 8.9 05/29/2014 0730   PROT 7.5 03/05/2021 1613   ALBUMIN 4.9 03/05/2021 1613   AST 21 03/05/2021 1613   ALT 17 03/05/2021 1613   ALKPHOS 85 03/05/2021 1613   BILITOT 0.3 03/05/2021 1613   GFRNONAA 81 05/30/2020 1145   GFRNONAA >60 05/29/2014 0730   GFRAA 94 05/30/2020 1145   GFRAA >60 05/29/2014 0730    Assessment: 1.  Loose stools: 3 to 4 days out of the week 5-6 loose stools with accompanying lower abdominal discomfort, history of anxiety; likely IBS 2.  Lower abdominal discomfort: With above  Plan: 1.  Discussed possible colonoscopy with the patient for the future but he would like to wait on this for now.  He would rather try to figure out what foods are bothering him so that he can avoid them in the future. 2.  Gave the patient a low FODMAP diet handout.  Discussed this with the patient.  Explained that he should stay on the "do eat side" for about 4 to 6 weeks.  Then he can slowly add in foods on the do not eat side to see if it bothers his system.  Patient would prefer to do things this way rather than start with procedures. 3.  Discussed the possible antispasmodic such as dicyclomine or hyoscyamine but patient would like to avoid more pills if possible.  May need to add these in later if the FODMAP diet alone is not helping. 4.  Discussed a probiotic with patient has tried these in the past for months at a time and  they made no difference. 5.  Patient to follow in clinic with me in 6 to 8 weeks, again if no better may need to discuss colonoscopy.  He was assigned to Dr. Lorenso Courier this morning.  Ellouise Newer, PA-C [facility] 03/31/2021, 9:47 AM  Cc: Mack Hook, MD

## 2021-04-01 NOTE — Progress Notes (Signed)
I agree with the assessment and plan as outlined by Eric Townsend. 

## 2021-04-07 ENCOUNTER — Ambulatory Visit: Payer: 59

## 2021-04-07 ENCOUNTER — Telehealth: Payer: Self-pay

## 2021-04-07 ENCOUNTER — Other Ambulatory Visit: Payer: Self-pay

## 2021-04-07 DIAGNOSIS — R29898 Other symptoms and signs involving the musculoskeletal system: Secondary | ICD-10-CM

## 2021-04-07 DIAGNOSIS — M542 Cervicalgia: Secondary | ICD-10-CM

## 2021-04-07 DIAGNOSIS — G8929 Other chronic pain: Secondary | ICD-10-CM

## 2021-04-07 DIAGNOSIS — M25512 Pain in left shoulder: Secondary | ICD-10-CM

## 2021-04-07 DIAGNOSIS — M546 Pain in thoracic spine: Secondary | ICD-10-CM

## 2021-04-07 DIAGNOSIS — R293 Abnormal posture: Secondary | ICD-10-CM

## 2021-04-07 NOTE — Therapy (Signed)
[facility] Outpatient Rehabilitation [facility] [address] [place], [place], [address] Phone: [phone]   Fax:  [phone]  Physical Therapy Treatment  Patient Details  Name: Eric Townsend MRN: [id number] Date of Birth: 1991-02-14 Referring Provider (PT): Julieanne Manson, MD   Encounter Date: 04/07/2021   PT End of Session - 04/07/21 1509     Visit Number 2    Number of Visits 7    Date for PT Re-Evaluation 05/14/21    Authorization Type [employer]    PT Start Time 1508    PT Stop Time 1544    PT Time Calculation (min) 36 min    Activity Tolerance Patient tolerated treatment well    Behavior During Therapy [facility] for tasks assessed/performed             Past Medical History:  Diagnosis Date   Bipolar disorder (HCC)    Generalized anxiety disorder    Was seen at [facility] for this or [employer] student counseling services. Has been on mediction:  Zoloft 100 mg--doesn't like, Prozac 40 mg   Testicular torsion     Past Surgical History:  Procedure Laterality Date   SURGERY SCROTAL / TESTICULAR Bilateral 05/2014   Removed left varicocele and performed bilateral orchiopexy    There were no vitals filed for this visit.   Subjective Assessment - 04/07/21 1516     Subjective Pt reports overall his neck, upper shoulders is a little better. Today, they are bothering more.    Patient Stated Goals To have exercises to be able to manage the pain better. To complete in a few sessions if possible.    Currently in Pain? Yes    Pain Score 6     Pain Location Neck   upper shoulder   Pain Orientation Left    Pain Descriptors / Indicators Aching;Burning;Tightness    Pain Type Chronic pain    Pain Onset More than a month ago    Pain Frequency Intermittent    Aggravating Factors  work- wasing dishes, disc golf, sleeping            [facility] Adult PT Treatment/Exercise:  Therapeutic Exercise: - upper trap stretch, L , 2x20" - levator stretch, L, 2x20" -  pec at 90d stretch, B, 2/20" - Cervical retraction, x10, 3 sec - Bil shoulder ER, x15, GTB    Manual Therapy: - STM to the L cervical paraspinals, levator, and upper trap with trigger point release provided. - STM was f/b TPDN  - Trigger Point Dry Needling Treatment: Pre-treatment instruction: Patient instructed on dry needling rationale, procedures, and possible side effects including pain during treatment (achy,cramping feeling), bruising, drop of blood, lightheadedness, nausea, sweating. Patient Consent Given: Yes Education handout provided: Yes Muscles treated: L cervical multifidi, L levator, L upper trap Needle size and number:  .3x30, 2 Electrical stimulation performed: No Parameters: N/A Treatment response/outcome: Twitch response elicited and Palpable decrease in muscle tension Post-treatment instructions: Patient instructed to expect possible mild to moderate muscle soreness later today and/or tomorrow. Patient instructed in methods to reduce muscle soreness and to continue prescribed HEP. If patient was dry needled over the lung field, patient was instructed on signs and symptoms of pneumothorax and, however unlikely, to see immediate medical attention should they occur. Patient was also educated on signs and symptoms of infection and to seek medical attention should they occur. Patient verbalized understanding of these instructions and education.   Neuromuscular re-ed: - NA  Therapeutic Activity: - NA  PT Education - 04/07/21 1641     Education Details education re: technique with HEP    Person(s) Educated Patient    Methods Explanation;Demonstration;Tactile cues;Verbal cues    Comprehension Verbalized understanding;Returned demonstration;Verbal cues required;Tactile cues required              PT Short Term Goals - 03/26/21 1329       PT SHORT TERM GOAL #1   Title STGs=LTGs               PT Long Term  Goals - 03/26/21 1330       PT LONG TERM GOAL #1   Title Pt will be Ind in a HEP to maintain achieved LOF.    Status New    Target Date 05/14/21      PT LONG TERM GOAL #2   Title Pt will voice understanding of measures to assist in the reduction of lower cervical, upper shoulder and mid back pain.    Status New    Target Date 05/14/21      PT LONG TERM GOAL #3   Title Increase pt cervical RON for L and R sidebending to 40d and L rotation to 60d for improved mobility and safety with driving and overall improved neck and UB function    Baseline see flowsheet    Status New    Target Date 05/14/21      PT LONG TERM GOAL #4   Title Pt's L lower cervica, upper shoulder and L mid back pain wil decrease to a range of 2-5/10 for impved neckand UB function and QOL.    Baseline 3-8/10    Status New    Target Date 05/14/21                   Plan - 04/07/21 1641     Clinical Impression Statement Pt' presents to PT reporting some improvement in her neck/upper shoulder pain. pt reports completing his HEP 5x since the eval 2 weeks ago.STM revealed TPs of the L cervical multidi, levators, and upper traps. After STM, TPDN was completed to these muscles f/b stretching and posterior chain strengthening. Pt was encourages to complete his HEP daily. Pt reported increased sore of the dry neeled areas which is expected. Pt tolerated the session without adverse effects.    Personal Factors and Comorbidities Time since onset of injury/illness/exacerbation;Comorbidity 2;Comorbidity 1;Past/Current Experience    Comorbidities bipolar, anxiety    Stability/Clinical Decision Making Stable/Uncomplicated    Clinical Decision Making Low    Rehab Potential Good    PT Frequency 1x / week    PT Duration 6 weeks    PT Treatment/Interventions ADLs/Self Care Home Management;Cryotherapy;Electrical Stimulation;Iontophoresis 4mg /ml Dexamethasone;Moist Heat;Traction;Ultrasound;Therapeutic exercise;Therapeutic  activities;Patient/family education;Manual techniques;Passive range of motion;Taping;Dry needling;Spinal Manipulations    PT Next Visit Plan Assess response to HEP, progress ther ex as indicated, dry needling as indicated    PT Home Exercise Plan [email or web address]             Patient will benefit from skilled therapeutic intervention in order to improve the following deficits and impairments:  Decreased range of motion, Pain, Impaired UE functional use, Decreased activity tolerance, Postural dysfunction, Decreased strength  Visit Diagnosis: Neck pain on left side  Arthralgia of left acromioclavicular joint  Chronic left-sided thoracic back pain  Abnormal posture  Decreased ROM of neck     Problem List Patient Active Problem List   Diagnosis Date Noted   Neck pain on left side 03/10/2021  Chronic left-sided thoracic back pain 03/10/2021   Diarrhea 03/10/2021   Scalp irritation 03/10/2021   Bipolar disorder (HCC)    Attention deficit hyperactivity disorder (ADHD), predominantly inattentive type 04/01/2020   Tinea capitis 11/10/2018   Raynaud's phenomenon without gangrene 08/08/2018   Pain in left acromioclavicular joint 08/08/2018   History of testicular mass 08/08/2018   Bipolar II disorder (HCC) 08/08/2018   Generalized anxiety disorder     Joellyn Rued MS, PT 04/07/21 5:07 PM   [facility] Outpatient Rehabilitation [facility] [address] [place], [place], [address] Phone: [phone]   Fax:  [phone]  Name: Eric Townsend MRN: [id number] Date of Birth: [birth year]

## 2021-04-07 NOTE — Telephone Encounter (Signed)
Pt would like to get a new dermatology referral to a different place. Current dermatology referral does not have availability until April 2023. Would also like to get restarted on terbinafine since scalp infection/issue has continued. Would like to be on terbinafine until he is able to see the specialist

## 2021-04-14 ENCOUNTER — Other Ambulatory Visit: Payer: Self-pay

## 2021-04-14 ENCOUNTER — Ambulatory Visit: Payer: 59

## 2021-04-14 DIAGNOSIS — M542 Cervicalgia: Secondary | ICD-10-CM | POA: Diagnosis not present

## 2021-04-14 DIAGNOSIS — R29898 Other symptoms and signs involving the musculoskeletal system: Secondary | ICD-10-CM

## 2021-04-14 DIAGNOSIS — R293 Abnormal posture: Secondary | ICD-10-CM

## 2021-04-14 DIAGNOSIS — M25512 Pain in left shoulder: Secondary | ICD-10-CM

## 2021-04-14 DIAGNOSIS — G8929 Other chronic pain: Secondary | ICD-10-CM

## 2021-04-14 NOTE — Therapy (Signed)
[facility] Outpatient Rehabilitation [facility] [address] [place], [place], [address] Phone: [phone]   Fax:  [phone]  Physical Therapy Treatment  Patient Details  Name: Eric Townsend MRN: [id number] Date of Birth: December 08, 1990 Referring Provider (PT): Julieanne Manson, MD   Encounter Date: 04/14/2021   PT End of Session - 04/14/21 1112     Visit Number 3    Number of Visits 7    Date for PT Re-Evaluation 05/14/21    Authorization Type [employer]    PT Start Time 1111   pt was 11 mins late   PT Stop Time 1145    PT Time Calculation (min) 34 min    Activity Tolerance Patient tolerated treatment well    Behavior During Therapy [facility] for tasks assessed/performed             Past Medical History:  Diagnosis Date   Bipolar disorder (HCC)    Generalized anxiety disorder    Was seen at [facility] for this or [employer] student counseling services. Has been on mediction:  Zoloft 100 mg--doesn't like, Prozac 40 mg   Testicular torsion     Past Surgical History:  Procedure Laterality Date   SURGERY SCROTAL / TESTICULAR Bilateral 05/2014   Removed left varicocele and performed bilateral orchiopexy    There were no vitals filed for this visit.   Subjective Assessment - 04/14/21 1117     Subjective Rates neck and shoulder pain 7/10. He is lifting more at work than usual. He questions if being too aggressive with stretching. Reports decrease in L shoulder pain after TPDN until yesterday.    Patient Stated Goals To have exercises to be able to manage the pain better. To complete in a few sessions if possible.    Currently in Pain? Yes    Pain Score 7     Pain Location Neck    Pain Orientation Left;Right    Pain Descriptors / Indicators Aching;Burning;Tightness    Pain Onset More than a month ago    Pain Frequency Intermittent    Aggravating Factors  work- wasing dishes, disc golf, sleeping    Pain Relieving Factors Some stretches he remembers               [facility] Adult PT Treatment/Exercise:   Therapeutic Exercise: - NA   Manual Therapy: - STM to the R and L cervical and thoracic paraspinals, levator, and upper trap. Trigger point release provided to R upper trap, levator, thoracic paraspinals and rhomboids. Muscle tightness of the L upper trap and levator is decreased since the last session and less in comparison to the R.  - STM was f/b TPDN   - Trigger Point Dry Needling Treatment: Pre-treatment instruction: Patient instructed on dry needling rationale, procedures, and possible side effects including pain during treatment (achy,cramping feeling), bruising, drop of blood, lightheadedness, nausea, sweating. Patient Consent Given: With previous session Education handout provided: Yes Muscles treated: R levator, R upper trap Needle size and number:  .3x30, 1 Electrical stimulation performed: No Parameters: N/A Treatment response/outcome: Twitch response elicited and Palpable decrease in muscle tension Post-treatment instructions: Patient instructed to expect possible mild to moderate muscle soreness later today and/or tomorrow. Patient instructed in methods to reduce muscle soreness and to continue prescribed HEP. If patient was dry needled over the lung field, patient was instructed on signs and symptoms of pneumothorax and, however unlikely, to see immediate medical attention should they occur. Patient was also educated on signs and symptoms of  infection and to seek medical attention should they occur. Patient verbalized understanding of these instructions and education.     Neuromuscular re-ed: - NA   Therapeutic Activity: - NA                            PT Short Term Goals - 03/26/21 1329       PT SHORT TERM GOAL #1   Title STGs=LTGs               PT Long Term Goals - 03/26/21 1330       PT LONG TERM GOAL #1   Title Pt will be Ind in a HEP to maintain achieved LOF.    Status New     Target Date 05/14/21      PT LONG TERM GOAL #2   Title Pt will voice understanding of measures to assist in the reduction of lower cervical, upper shoulder and mid back pain.    Status New    Target Date 05/14/21      PT LONG TERM GOAL #3   Title Increase pt cervical RON for L and R sidebending to 40d and L rotation to 60d for improved mobility and safety with driving and overall improved neck and UB function    Baseline see flowsheet    Status New    Target Date 05/14/21      PT LONG TERM GOAL #4   Title Pt's L lower cervica, upper shoulder and L mid back pain wil decrease to a range of 2-5/10 for impved neckand UB function and QOL.    Baseline 3-8/10    Status New    Target Date 05/14/21                   Plan - 04/14/21 1113     Clinical Impression Statement Pt was late for today's appt. PT was completed for STM and TPM bilat cervcal, upper shoulder and mid back. This was f/b TPDN to the R upper tap and levator. Muscle tightness of the L upper trap and levator is decreased since the last session of TPDN and less in comparison to the R. Pt tolerated today's PT session without adverse effects. 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    Personal Factors and Comorbidities Time since onset of injury/illness/exacerbation;Comorbidity 2;Comorbidity 1;Past/Current Experience    Comorbidities bipolar, anxiety    Stability/Clinical Decision Making Stable/Uncomplicated    Clinical Decision Making Low    Rehab Potential Good    PT Frequency 1x / week    PT Duration 6 weeks    PT Treatment/Interventions ADLs/Self Care Home Management;Cryotherapy;Electrical Stimulation;Iontophoresis 4mg /ml Dexamethasone;Moist Heat;Traction;Ultrasound;Therapeutic exercise;Therapeutic activities;Patient/family education;Manual  techniques;Passive range of motion;Taping;Dry needling;Spinal Manipulations    PT Next Visit Plan Complete TPDN as indicated. Progress posterior chain strengthening to address postural demands of work.    PT Home Exercise Plan [email or web address]    Consulted and Agree with Plan of Care Patient             Patient will benefit from skilled therapeutic intervention in order to improve the following deficits and impairments:  Decreased range of motion, Pain, Impaired UE functional use, Decreased activity tolerance, Postural dysfunction, Decreased strength  Visit Diagnosis: Neck pain on left side  Arthralgia of  left acromioclavicular joint  Chronic left-sided thoracic back pain  Abnormal posture  Decreased ROM of neck     Problem List Patient Active Problem List   Diagnosis Date Noted   Neck pain on left side 03/10/2021   Chronic left-sided thoracic back pain 03/10/2021   Diarrhea 03/10/2021   Scalp irritation 03/10/2021   Bipolar disorder (HCC)    Attention deficit hyperactivity disorder (ADHD), 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

## 2021-04-20 NOTE — Telephone Encounter (Signed)
Can you call around and see if other derm offices have appts available sooner?   I would like to see him again before starting Terbinafine and see if can get a bacterial and fungal culture again now that he has been off meds for some time.  Can put in as acute.

## 2021-04-21 NOTE — Telephone Encounter (Signed)
Needs acute before starting terbinafine

## 2021-04-22 ENCOUNTER — Other Ambulatory Visit: Payer: Self-pay

## 2021-04-22 ENCOUNTER — Ambulatory Visit: Payer: 59

## 2021-04-22 DIAGNOSIS — G8929 Other chronic pain: Secondary | ICD-10-CM

## 2021-04-22 DIAGNOSIS — M25512 Pain in left shoulder: Secondary | ICD-10-CM

## 2021-04-22 DIAGNOSIS — R293 Abnormal posture: Secondary | ICD-10-CM

## 2021-04-22 DIAGNOSIS — M546 Pain in thoracic spine: Secondary | ICD-10-CM

## 2021-04-22 DIAGNOSIS — M542 Cervicalgia: Secondary | ICD-10-CM

## 2021-04-22 DIAGNOSIS — R29898 Other symptoms and signs involving the musculoskeletal system: Secondary | ICD-10-CM

## 2021-04-22 NOTE — Therapy (Signed)
[facility] Outpatient Rehabilitation [facility] [address] [place], [place], [address] Phone: [phone]   Fax:  [phone]  Physical Therapy Treatment  Patient Details  Name: Eric Townsend MRN: [id number] Date of Birth: Jan 04, 1991 Referring Provider (PT): Julieanne Manson, MD   Encounter Date: 04/22/2021   PT End of Session - 04/22/21 1106     Visit Number 4    Number of Visits 7    Date for PT Re-Evaluation 05/14/21    Authorization Type [employer]    PT Start Time 1105    PT Stop Time 1148    PT Time Calculation (min) 43 min    Activity Tolerance Patient tolerated treatment well    Behavior During Therapy [facility] for tasks assessed/performed             Past Medical History:  Diagnosis Date   Bipolar disorder (HCC)    Generalized anxiety disorder    Was seen at [facility] for this or [employer] student counseling services. Has been on mediction:  Zoloft 100 mg--doesn't like, Prozac 40 mg   Testicular torsion     Past Surgical History:  Procedure Laterality Date   SURGERY SCROTAL / TESTICULAR Bilateral 05/2014   Removed left varicocele and performed bilateral orchiopexy    There were no vitals filed for this visit.   Subjective Assessment - 04/22/21 1110     Subjective Pt reports trying a new pillow (thiner) thepast several days and he has had more stillness with his neck in the AM and with cervical ext. Pt notes he sleeps on his back most of the time.    Patient Stated Goals To have exercises to be able to manage the pain better. To complete in a few sessions if possible.    Currently in Pain? Yes    Pain Score 7     Pain Location Neck    Pain Orientation Right;Left    Pain Descriptors / Indicators Aching;Tightness    Pain Type Chronic pain    Pain Onset More than a month ago    Pain Frequency Intermittent    Aggravating Factors  work- washing dishes, disc golf, sleeping                                         PT Education - 04/22/21 2234     Education Details Education re: proper pillow support for sleeping. And correction of technique for cervical retraction ex.    Person(s) Educated Patient    Methods Demonstration;Explanation;Tactile cues;Verbal cues    Comprehension Verbalized understanding;Returned demonstration;Verbal cues required;Tactile cues required;Need further instruction            [facility] Adult PT Treatment/Exercise:   Therapeutic Exercise: - Cervical retraction x10, 3 sec     Manual Therapy: - Axial cervical traction 20 sec on, 10 sec off for 8 mins.  - Sub-occipital release - STM to the R and L cervical and thoracic rhomboids, levators, and upper traps. Trigger point release provided to L upper trap, levator, and rhomboids. Decreased muscle tension was noted of the L levator and upper traps.   Neuromuscular re-ed: - NA   Therapeutic Activity: - NA    PT Short Term Goals - 03/26/21 1329       PT SHORT TERM GOAL #1   Title STGs=LTGs               PT  Long Term Goals - 03/26/21 1330       PT LONG TERM GOAL #1   Title Pt will be Ind in a HEP to maintain achieved LOF.    Status New    Target Date 05/14/21      PT LONG TERM GOAL #2   Title Pt will voice understanding of measures to assist in the reduction of lower cervical, upper shoulder and mid back pain.    Status New    Target Date 05/14/21      PT LONG TERM GOAL #3   Title Increase pt cervical RON for L and R sidebending to 40d and L rotation to 60d for improved mobility and safety with driving and overall improved neck and UB function    Baseline see flowsheet    Status New    Target Date 05/14/21      PT LONG TERM GOAL #4   Title Pt's L lower cervica, upper shoulder and L mid back pain wil decrease to a range of 2-5/10 for impved neckand UB function and QOL.    Baseline 3-8/10    Status New    Target Date 05/14/21                    Plan - 04/22/21 1106     Clinical Impression Statement Pt presents to PT with increased neck and mid back pain appearing to be related to his sleeping postion from a sustain position primarily in cervical ext. Sleeping positions were reviewed and pillow support. Cervical axila traction was applied as well as STM and TPM with decreased muscle tightness of the L upper traps and levator. Proper technique for cervical retarction was reviewed and completed. After the session, pt reported his neck pain decreased to 4/10, and his neck felt more mobile.    Personal Factors and Comorbidities Time since onset of injury/illness/exacerbation;Comorbidity 2;Comorbidity 1;Past/Current Experience    Comorbidities bipolar, anxiety    Stability/Clinical Decision Making Stable/Uncomplicated    Clinical Decision Making Low    Rehab Potential Good    PT Frequency 1x / week    PT Duration 6 weeks    PT Treatment/Interventions ADLs/Self Care Home Management;Cryotherapy;Electrical Stimulation;Iontophoresis 4mg /ml Dexamethasone;Moist Heat;Traction;Ultrasound;Therapeutic exercise;Therapeutic activities;Patient/family education;Manual techniques;Passive range of motion;Taping;Dry needling;Spinal Manipulations    PT Next Visit Plan Complete TPDN as indicated. Progress posterior chain strengthening to address postural demands of work.    PT Home Exercise Plan [id number]             Patient will benefit from skilled therapeutic intervention in order to improve the following deficits and impairments:  Decreased range of motion, Pain, Impaired UE functional use, Decreased activity tolerance, Postural dysfunction, Decreased strength  Visit Diagnosis: Neck pain on left side  Arthralgia of left acromioclavicular joint  Chronic left-sided thoracic back pain  Abnormal posture  Decreased ROM of neck     Problem List Patient Active Problem List   Diagnosis Date Noted   Neck pain on left side  03/10/2021   Chronic left-sided thoracic back pain 03/10/2021   Diarrhea 03/10/2021   Scalp irritation 03/10/2021   Bipolar disorder (HCC)    Attention deficit hyperactivity disorder (ADHD), predominantly inattentive type 04/01/2020   Tinea capitis 11/10/2018   Raynaud's phenomenon without gangrene 08/08/2018   Pain in left acromioclavicular joint 08/08/2018   History of testicular mass 08/08/2018   Bipolar II disorder (HCC) 08/08/2018   Generalized anxiety disorder    08/10/2018 MS, PT 04/22/21 11:07 PM  [facility] Outpatient Rehabilitation [facility] [address] [place], [place], [address] Phone: [phone]   Fax:  [phone]  Name: IVEN EARNHART MRN: [id number] Date of Birth: 1990/09/11

## 2021-04-28 ENCOUNTER — Encounter: Payer: Self-pay | Admitting: Internal Medicine

## 2021-04-28 ENCOUNTER — Other Ambulatory Visit: Payer: Self-pay

## 2021-04-28 ENCOUNTER — Ambulatory Visit (INDEPENDENT_AMBULATORY_CARE_PROVIDER_SITE_OTHER): Payer: Self-pay | Admitting: Internal Medicine

## 2021-04-28 VITALS — BP 110/74 | HR 76 | Resp 12 | Ht 74.0 in | Wt 164.0 lb

## 2021-04-28 DIAGNOSIS — R197 Diarrhea, unspecified: Secondary | ICD-10-CM

## 2021-04-28 DIAGNOSIS — L089 Local infection of the skin and subcutaneous tissue, unspecified: Secondary | ICD-10-CM

## 2021-04-28 MED ORDER — TERBINAFINE HCL 250 MG PO TABS: 250.0000 mg | ORAL_TABLET | Freq: Every day | ORAL | 0 refills | Status: DC

## 2021-04-28 NOTE — Telephone Encounter (Signed)
Seen by dr mulberry °

## 2021-04-28 NOTE — Progress Notes (Signed)
    Subjective:    Patient ID: Eric Townsend, male   DOB: 01-12-1991, 30 y.o.   MRN: [id number]   HPI   Scalp irritation with associated hair loss:  wants to try treating again with Nizoral.  Felt is what much better after treatment, though never really saw what it looked like after full treatment here.  Has not had a culture done for 2 years.  Culture from scalp and hair scrapings previously did not grow.  Does use Nizoral shampoo twice weekly.  2.  GI:  Was seen beginning of Nov. By Hyacinth Meeker, PA-C.  Appears leaning toward diagnosis of  IBS, but considering colonoscopy if does not improve.  Today discussed also Tissue transglutaminase Ab negative, Sed rate, CBC, CMP normal from last visit.  3.  Influenza vaccine request:  out today.  Current Meds  Medication Sig   buPROPion (WELLBUTRIN XL) 300 MG 24 hr tablet Take 1 tablet (300 mg total) by mouth every morning.   lamoTRIgine (LAMICTAL) 100 MG tablet Take 1 tablet (100 mg total) by mouth daily.   terbinafine (LAMISIL) 250 MG tablet Take 1 tablet (250 mg total) by mouth daily.   traZODone (DESYREL) 100 MG tablet Take 1 tablet (100 mg total) by mouth at bedtime.   No Known Allergies   Review of Systems    Objective:   BP 110/74 (BP Location: Right Arm, Patient Position: Sitting, Cuff Size: Normal)   Pulse 76   Resp 12   Ht 6\' 2"  (1.88 m)   Wt 164 lb (74.4 kg)   BMI 21.06 kg/m   Physical Exam  Right scalp with hair loss/broken hair and areas of flaking, inflammation, scabing.  Scraping of inflamed/flaking areas performed for fungal culture.   Assessment & Plan   Scalp irritation and hair loss:  does look like tinea capitis, but when cultured in past, nothing grew and had difficulty getting patient in after treatment to determine if had good result.  Try scraping for fungal culture again.  Terbinafine 250 mg for 8 weeks with follow up as finishes.  Hepatic profile then as we  2.  Diarrhea:  Likely IBS.  Discussed  does not appear to have inflammatory issue or gluten sensitivity.  He is apparently doing fingersticks and sending blood off to a lab for food sensitivities.  Discussed I am not aware of the place he is sending these specimens.  3.  Request for influenza vaccine:  we have been unable to find alternate influenza vaccine as usual supplier is out.  Encouraged obtaining at pharmacy of choice.

## 2021-05-06 ENCOUNTER — Ambulatory Visit: Payer: 59 | Attending: Internal Medicine

## 2021-05-06 ENCOUNTER — Other Ambulatory Visit: Payer: Self-pay

## 2021-05-06 DIAGNOSIS — M25512 Pain in left shoulder: Secondary | ICD-10-CM | POA: Insufficient documentation

## 2021-05-06 DIAGNOSIS — M542 Cervicalgia: Secondary | ICD-10-CM | POA: Diagnosis not present

## 2021-05-06 DIAGNOSIS — G8929 Other chronic pain: Secondary | ICD-10-CM | POA: Diagnosis present

## 2021-05-06 DIAGNOSIS — R29898 Other symptoms and signs involving the musculoskeletal system: Secondary | ICD-10-CM | POA: Diagnosis present

## 2021-05-06 DIAGNOSIS — R293 Abnormal posture: Secondary | ICD-10-CM | POA: Diagnosis present

## 2021-05-06 DIAGNOSIS — M546 Pain in thoracic spine: Secondary | ICD-10-CM | POA: Diagnosis present

## 2021-05-06 NOTE — Therapy (Signed)
[facility] Outpatient Rehabilitation [facility] [address] [place], [place], [address] Phone: [phone]   Fax:  [phone]  Physical Therapy Treatment  Patient Details  Name: Eric Townsend MRN: [id number] Date of Birth: [birth year] Referring Provider (PT): Julieanne Manson, MD   Encounter Date: 05/06/2021   PT End of Session - 05/06/21 1438     Visit Number 5    Number of Visits 7    Date for PT Re-Evaluation 05/14/21    Authorization Type [employer]    PT Start Time 1423    PT Stop Time 1506    PT Time Calculation (min) 43 min    Activity Tolerance Patient tolerated treatment well    Behavior During Therapy [facility] for tasks assessed/performed             Past Medical History:  Diagnosis Date   Bipolar disorder (HCC)    Generalized anxiety disorder    Was seen at [facility] for this or [employer] student counseling services. Has been on mediction:  Zoloft 100 mg--doesn't like, Prozac 40 mg   Testicular torsion     Past Surgical History:  Procedure Laterality Date   SURGERY SCROTAL / TESTICULAR Bilateral 05/2014   Removed left varicocele and performed bilateral orchiopexy    There were no vitals filed for this visit.   Subjective Assessment - 05/06/21 1428     Subjective Pt reports he has not gotten a new pillow yet but plans to in the next couple of day. Neck has been feeling better with work closed for the past week due to moving.    Patient Stated Goals To have exercises to be able to manage the pain better. To complete in a few sessions if possible.    Currently in Pain? Yes    Pain Score 3     Pain Location Neck    Pain Orientation Right;Left    Pain Descriptors / Indicators Aching;Tightness    Pain Type Chronic pain    Pain Onset More than a month ago    Pain Frequency Intermittent    Aggravating Factors  work- washing dishes, disc golf, sleeping    Pain Relieving Factors Some stretches he remembers                [facility] Adult  PT Treatment/Exercise:   Therapeutic Exercise: - Cervical retraction x10, 3 sec  - Upper trap stretch, x3, 15 sec - Levator stretch x3, 15, 15 sec - Shoulder ER, GTB, 2x15  Manual Therapy: - STM to the R and L cervical and thoracic rhomboids, levators, and upper traps. Increased muscle tension was present with trigger points identified and most significantly TTP of the levators bilaterally.  Trigger Point Dry Needling Treatment: Pre-treatment instruction: Patient instructed on dry needling rationale, procedures, and possible side effects including pain during treatment (achy,cramping feeling), bruising, drop of blood, lightheadedness, nausea, sweating. Patient Consent Given: Yes Education handout provided: Previously provided Muscles treated: Levator bilat  Needle size and number:  .30x17mm,1  Electrical stimulation performed: No Parameters: NA Treatment response/outcome: Palpable decrease in muscle tension; muscle twitch Post-treatment instructions: Patient instructed to expect possible mild to moderate muscle soreness later today and/or tomorrow. Patient instructed in methods to reduce muscle soreness and to continue prescribed HEP. If patient was dry needled over the lung field, patient was instructed on signs and symptoms of pneumothorax and, however unlikely, to see immediate medical attention should they occur. Patient was also educated on signs and symptoms of infection and to  seek medical attention should they occur. Patient verbalized understanding of these instructions and education.  Neuromuscular re-ed: - NA   Therapeutic Activity: - NA                          PT Education - 05/06/21 1524     Education Details Reviewed use of massage cane to address TP pain. Pt states he has a massage cane but it does not seem to help. With further discussion it appears the pt is applying too much pressure and he is going to attempt with less pressure. Pt's upper trap  and levator stretches were also reviewed with greater effectiveness with stabilization of the opposite shoulder.    Person(s) Educated Patient    Methods Explanation;Demonstration;Tactile cues;Verbal cues    Comprehension Verbalized understanding;Returned demonstration;Verbal cues required;Tactile cues required              PT Short Term Goals - 03/26/21 1329       PT SHORT TERM GOAL #1   Title STGs=LTGs               PT Long Term Goals - 05/06/21 1544       PT LONG TERM GOAL #1   Title Pt will be Ind in a HEP to maintain achieved LOF. 05/06/21: verbal and tactile cueing needed for most effective completion of HEP.    Status On-going    Target Date 05/14/21                   Plan - 05/06/21 1520     Clinical Impression Statement Education was provide to assist in more effective use of a massage cane and for upper trap and levator stretches. 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 TPDN was completed to the bilat levators with muscle twitches and decreased muscle tension. Pt tolerated today's PT session without adverse effects. Will complete reassessment the next PT session to determine overall progress vs. need for continuation of PT services    Personal Factors and Comorbidities Time since onset of injury/illness/exacerbation;Comorbidity 2;Comorbidity 1;Past/Current Experience    Comorbidities bipolar, anxiety    Stability/Clinical Decision Making Stable/Uncomplicated    Clinical Decision Making Low    Rehab Potential Good    PT Frequency 1x / week    PT Duration 6 weeks    PT Treatment/Interventions ADLs/Self Care Home Management;Cryotherapy;Electrical Stimulation;Iontophoresis 4mg /ml Dexamethasone;Moist Heat;Traction;Ultrasound;Therapeutic exercise;Therapeutic activities;Patient/family education;Manual techniques;Passive range of motion;Taping;Dry needling;Spinal Manipulations     PT Next Visit Plan Complete TPDN as indicated. Progress posterior chain strengthening to address postural demands of work.    PT Home Exercise Plan [id number]    Consulted and Agree with Plan of Care Patient             Patient will benefit from skilled therapeutic intervention in order to improve the following deficits and impairments:  Decreased range of motion, Pain, Impaired UE functional use, Decreased activity tolerance, Postural dysfunction, Decreased strength  Visit Diagnosis: Neck pain on left side  Arthralgia of left acromioclavicular joint  Chronic left-sided thoracic back pain  Abnormal posture  Decreased ROM of neck     Problem List Patient Active Problem List   Diagnosis Date Noted   Neck pain on left side 03/10/2021   Chronic left-sided thoracic back pain 03/10/2021   Diarrhea 03/10/2021   Scalp irritation 03/10/2021   Bipolar  disorder (HCC)    Attention deficit hyperactivity disorder (ADHD), predominantly inattentive type 04/01/2020   Tinea capitis 11/10/2018   Raynaud's phenomenon without gangrene 08/08/2018   Pain in left acromioclavicular joint 08/08/2018   History of testicular mass 08/08/2018   Bipolar II disorder (HCC) 08/08/2018   Generalized anxiety disorder     Joellyn Rued MS, PT 05/06/21 3:48 PM   [facility] Outpatient Rehabilitation [facility] [address] [place], [place], [address] Phone: [phone]   Fax:  [phone]  Name: Eric Townsend MRN: [id number] Date of Birth: [birth year]

## 2021-05-12 ENCOUNTER — Ambulatory Visit (INDEPENDENT_AMBULATORY_CARE_PROVIDER_SITE_OTHER): Payer: 59 | Admitting: Physician Assistant

## 2021-05-12 ENCOUNTER — Encounter: Payer: Self-pay | Admitting: Physician Assistant

## 2021-05-12 VITALS — BP 90/60 | HR 100 | Ht 74.0 in | Wt 166.4 lb

## 2021-05-12 DIAGNOSIS — R195 Other fecal abnormalities: Secondary | ICD-10-CM | POA: Diagnosis not present

## 2021-05-12 NOTE — Patient Instructions (Addendum)
If you are age 32 or younger, your body mass index should be between 19-25. Your Body mass index is 21.36 kg/m. If this is out of the aformentioned range listed, please consider follow up with your Primary Care Provider.   The [place] [facility] providers would like to encourage you to use [facility] to communicate with providers for non-urgent requests or questions.  Due to long hold times on the telephone, sending your provider a message by [facility] may be faster and more efficient way to get a response. Please allow 48 business hours for a response.  Please remember that this is for non-urgent requests/questions.  We will contact you closer to [facility] for a follow up appointment.  Follow the low FODMAP diet information given today.  It was great seeing you today! Thank you for entrusting me with your care and choosing [facility].  Hyacinth Meeker, [place]

## 2021-05-12 NOTE — Progress Notes (Signed)
Chief Complaint: Follow-up loose stools  HPI:    Eric Townsend is a 30 year old male, assigned to Dr. Lorenso Courier at last visit, with a past medical history as listed below including generalized anxiety disorder and bipolar disorder, who returns to clinic today for follow-up of his loose stools.    03/05/2021 CBC, CMP, ESR and celiac studies were all normal.    03/31/2021 office visit with me and described being lactose intolerant but continuing with diarrhea regardless of using Lactaid as well as some lower abdominal discomfort 3 to 4 days out of the week with 5-6 stools that day.  Other times he would have formed stool.  At that time discussed possibility of a colonoscopy in the future but patient wanted to wait.  If the patient a low FODMAP diet handout.  Also discussed possible antispasmodic such as dicyclomine or hyoscyamine but patient want to avoid pills if possible.  Discussed a probiotic but patient had tried them in the past and they made no difference.  Discussed if he is no better follow-up he would need a colonoscopy.    Today, the patient tells me that he is feeling somewhat better after trying to follow the low FODMAP diet.  Does tell me has been difficult over the holidays including Thanksgiving and now Christmas.  So he is not followed it very strictly, but has figured out some other foods which cause him problems.  He certainly has not had an increase in his symptoms.    Denies fever, chills, weight loss or blood in his stool.  Past Medical History:  Diagnosis Date   Bipolar disorder ([place])    Generalized anxiety disorder    Was seen at [facility] for this or [employer] student counseling services. Has been on mediction:  Zoloft 100 mg--doesn't like, Prozac 40 mg   Testicular torsion     Past Surgical History:  Procedure Laterality Date   SURGERY SCROTAL / TESTICULAR Bilateral 05/2014   Removed left varicocele and performed bilateral orchiopexy    Current Outpatient Medications  Medication  Sig Dispense Refill   buPROPion (WELLBUTRIN XL) 300 MG 24 hr tablet Take 1 tablet (300 mg total) by mouth every morning. 30 tablet 3   lamoTRIgine (LAMICTAL) 100 MG tablet Take 1 tablet (100 mg total) by mouth daily. 30 tablet 3   terbinafine (LAMISIL) 250 MG tablet Take 1 tablet (250 mg total) by mouth daily. 56 tablet 0   traZODone (DESYREL) 100 MG tablet Take 1 tablet (100 mg total) by mouth at bedtime. 30 tablet 3   No current facility-administered medications for this visit.    Allergies as of 05/12/2021   (No Known Allergies)    Family History  Problem Relation Age of Onset   Obesity Mother    Other Mother        Prediabetes   Kidney disease Father    Diabetes Father    Obesity Father    Hyperlipidemia Father    Depression Father        and Anxiety   Other Father 62       COVID    Social History   Socioeconomic History   Marital status: Soil scientist    Spouse name: Angela Nevin   Number of children: 0   Years of education: college grad sociology   Highest education level: Not on file  Occupational History   Occupation: Actuary.  Tobacco Use   Smoking status: Former    Packs/day: 0.25    Years:  9.00    Pack years: 2.25    Types: Cigarettes    Quit date: 07/16/2018    Years since quitting: 2.8   Smokeless tobacco: Never  Substance and Sexual Activity   Alcohol use: Yes    Comment: History of too much, but now just occasional.   Drug use: Yes    Types: Marijuana   Sexual activity: Yes    Birth control/protection: Condom  Other Topics Concern   Not on file  Social History Narrative   Currently not working since 12/2019   Lives with long term girlfriend, Angela Nevin, who is a high Education officer, museum.   Social Determinants of Health   Financial Resource Strain: Not on file  Food Insecurity: Not on file  Transportation Needs: Not on file  Physical Activity: Not on file  Stress: Not on file  Social Connections: Not on file  Intimate Partner Violence: Not  on file    Review of Systems:    Constitutional: No weight loss, fever or chills Cardiovascular: No chest pain Respiratory: No SOB  Gastrointestinal: See HPI and otherwise negative   Physical Exam:  Vital signs: BP 90/60    Pulse 100    Ht _0  (1.88 m)    Wt 166 lb 6.4 oz (75.5 kg)    BMI 21.36 kg/m    Constitutional:   Pleasant AA male appears to be in NAD, Well developed, Well nourished, alert and cooperative Respiratory: Respirations even and unlabored. Lungs clear to auscultation bilaterally.   No wheezes, crackles, or rhonchi.  Cardiovascular: Normal S1, S2. No MRG. Regular rate and rhythm. No peripheral edema, cyanosis or pallor.  Gastrointestinal:  Soft, nondistended, nontender. No rebound or guarding. Normal bowel sounds. No appreciable masses or hepatomegaly. Rectal:  Not performed.  Psychiatric: Demonstrates good judgement and reason without abnormal affect or behaviors.  RELEVANT LABS AND IMAGING: CBC    Component Value Date/Time   WBC 5.5 03/05/2021 1613   WBC 10.6 05/29/2014 0730   RBC 5.71 03/05/2021 1613   RBC 5.28 05/29/2014 0730   HGB 16.4 03/05/2021 1613   HCT 49.3 03/05/2021 1613   PLT 268 03/05/2021 1613   MCV 86 03/05/2021 1613   MCV 88 05/29/2014 0730   MCH 28.7 03/05/2021 1613   MCH 27.8 05/29/2014 0730   MCHC 33.3 03/05/2021 1613   MCHC 31.5 (L) 05/29/2014 0730   RDW 12.4 03/05/2021 1613   RDW 13.5 05/29/2014 0730   LYMPHSABS 2.6 03/05/2021 1613   EOSABS 0.1 03/05/2021 1613   BASOSABS 0.0 03/05/2021 1613    CMP     Component Value Date/Time   NA 142 03/05/2021 1613   NA 141 05/29/2014 0730   K 4.4 03/05/2021 1613   K 3.9 05/29/2014 0730   CL 104 03/05/2021 1613   CL 106 05/29/2014 0730   CO2 23 03/05/2021 1613   CO2 28 05/29/2014 0730   GLUCOSE 96 03/05/2021 1613   GLUCOSE 90 05/29/2014 0730   BUN 11 03/05/2021 1613   BUN 10 05/29/2014 0730   CREATININE 1.06 03/05/2021 1613   CREATININE 0.97 05/29/2014 0730   CALCIUM 9.7  03/05/2021 1613   CALCIUM 8.9 05/29/2014 0730   PROT 7.5 03/05/2021 1613   ALBUMIN 4.9 03/05/2021 1613   AST 21 03/05/2021 1613   ALT 17 03/05/2021 1613   ALKPHOS 85 03/05/2021 1613   BILITOT 0.3 03/05/2021 1613   GFRNONAA 81 05/30/2020 1145   GFRNONAA >60 05/29/2014 0730   GFRAA 94 05/30/2020 1145  GFRAA >60 05/29/2014 0730    Assessment: 1.  Loose stools: Again likely related to IBS given patient's high anxiety, symptoms are some better on the low FODMAP diet  Plan: 1.  At this time patient still declines a colonoscopy.  Explained this would be the next step in working things up when he is ready/willing. 2.  Patient would like to really strictly follow the low FODMAP diet for 6 weeks to see if he can get symptoms better than they are now.  We reviewed this again today. 3.  Patient will follow in clinic with me in February.  He was placed in recall for this.  Ellouise Newer, PA-C [facility] 05/12/2021, 2:11 PM  Cc: Mack Hook, MD

## 2021-05-13 ENCOUNTER — Ambulatory Visit: Payer: 59

## 2021-05-20 ENCOUNTER — Other Ambulatory Visit: Payer: Self-pay

## 2021-05-20 ENCOUNTER — Ambulatory Visit: Payer: 59

## 2021-05-20 DIAGNOSIS — R29898 Other symptoms and signs involving the musculoskeletal system: Secondary | ICD-10-CM

## 2021-05-20 DIAGNOSIS — M542 Cervicalgia: Secondary | ICD-10-CM

## 2021-05-20 DIAGNOSIS — M25512 Pain in left shoulder: Secondary | ICD-10-CM

## 2021-05-20 DIAGNOSIS — R293 Abnormal posture: Secondary | ICD-10-CM

## 2021-05-20 DIAGNOSIS — M546 Pain in thoracic spine: Secondary | ICD-10-CM

## 2021-05-20 NOTE — Therapy (Signed)
[place], [place], [address] Phone: [phone]   Fax:  [phone]  Physical Therapy Treatment/Discharge/ERO  Patient Details  Name: Eric Townsend MRN: [id number] Date of Birth: 1990-09-25 Referring Provider (PT): Mack Hook, MD   Encounter Date: 05/20/2021   PT End of Session - 05/20/21 1212     Visit Number 6    Number of Visits 7    Date for PT Re-Evaluation 05/20/21    Authorization Type [employer]    PT Start Time 1155    PT Stop Time 1240    PT Time Calculation (min) 45 min    Activity Tolerance Patient tolerated treatment well    Behavior During Therapy [facility] for tasks assessed/performed             Past Medical History:  Diagnosis Date   Bipolar disorder ([place])    Generalized anxiety disorder    Was seen at [facility] for this or [employer] student counseling services. Has been on mediction:  Zoloft 100 mg--doesn't like, Prozac 40 mg   Testicular torsion     Past Surgical History:  Procedure Laterality Date   SURGERY SCROTAL / TESTICULAR Bilateral 05/2014   Removed left varicocele and performed bilateral orchiopexy    There were no vitals filed for this visit.   Subjective Assessment - 05/20/21 1202     Subjective Pt has obtained a new pillow which has been helpful. Pt reports not working over 3 weeks has helped to reduced hsi neck pain. Pt plans to play freisbee golf later today.    Patient Stated Goals To have exercises to be able to manage the pain better. To complete in a few sessions if possible.    Currently in Pain? Yes    Pain Score 2     Pain Location Neck    Pain Orientation Right;Left    Pain Onset More than a month ago                        [facility] Adult PT Treatment/Exercise:   Therapeutic Exercise: - Cervical retraction x10, 3" - Upper trap stretch, x2, 15" - Levator stretch x2, 15 sec - Shoulder ER, GTB, 2x15" - Cross arm stretch c trunk  rotation, 2x15" - Scalene stretch, 2x15"   Manual Therapy: - STM to the R and L cervical levators and upper traps, f/b TPR to those areas. Decreased muscle was palpated afterwrads                    PT Education - 05/20/21 1319     Education Details Use of theracane for trigger point massage. Proper sitting posture and work station set up.    Person(s) Educated Patient    Methods Explanation;Demonstration;Tactile cues;Verbal cues;Handout    Comprehension Verbalized understanding;Returned demonstration;Verbal cues required;Tactile cues required              PT Short Term Goals - 03/26/21 1329       PT SHORT TERM GOAL #1   Title STGs=LTGs               PT Long Term Goals - 05/20/21 1329       PT LONG TERM GOAL #1   Title Pt will be Ind in a HEP to maintain achieved LOF. 05/06/21: verbal and tactile cueing needed for most effective completion of HEP.    Status Achieved    Target Date 05/20/21  PT LONG TERM GOAL #2   Title Pt will voice understanding of measures to assist in the reduction of lower cervical, upper shoulder and mid back pain.    Status Achieved    Target Date 05/20/21      PT LONG TERM GOAL #3   Title Increase pt cervical RON for L and R sidebending to 40d and L rotation to 60d for improved mobility and safety with driving and overall improved neck and UB function, L and R side bending 43d, R 46 and  L rotation 64    Baseline see flowsheet    Status Achieved    Target Date 05/20/21      PT LONG TERM GOAL #4   Title Pt's L lower cervica, upper shoulder and L mid back pain wil decrease to a range of 2-5/10 for impved neckand UB function and QOL. 05/20/21: 2-3/10    Baseline 3-8/10    Status Achieved    Target Date 05/20/21                   Plan - 05/20/21 1218     Clinical Impression Statement Pt was provided for pt Education, see section and for final HEP. Pt was received STM for the bilat upper shoulders with TPR  provided the to levator and upper traps. Over the course of PT, Pt has responded well with decrease and improved cerviacal mobility. Pt has found the HEP provided has been benefical in decresing his cervical/upper shoulder pain. 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 Pt is Dced from PT sevices with PT goals being met.    Personal Factors and Comorbidities Time since onset of injury/illness/exacerbation;Comorbidity 2;Comorbidity 1;Past/Current Experience    Comorbidities bipolar, anxiety    Stability/Clinical Decision Making Stable/Uncomplicated    Clinical Decision Making Low    Rehab Potential Good    PT Frequency 1x / week    PT Duration 6 weeks    PT Treatment/Interventions ADLs/Self Care Home Management;Cryotherapy;Electrical Stimulation;Iontophoresis 37m/ml Dexamethasone;Moist Heat;Traction;Ultrasound;Therapeutic exercise;Therapeutic activities;Patient/family education;Manual techniques;Passive range of motion;Taping;Dry needling;Spinal Manipulations    PT Next Visit Plan --    PT Home Exercise Plan [id number]    Consulted and Agree with Plan of Care Patient             Patient will benefit from skilled therapeutic intervention in order to improve the following deficits and impairments:  Decreased range of motion, Pain, Impaired UE functional use, Decreased activity tolerance, Postural dysfunction, Decreased strength  Visit Diagnosis: Neck pain on left side  Arthralgia of left acromioclavicular joint  Chronic left-sided thoracic back pain  Abnormal posture  Decreased ROM of neck     Problem List Patient Active Problem List   Diagnosis Date Noted   Neck pain on left side 03/10/2021   Chronic left-sided thoracic back pain 03/10/2021   Diarrhea 03/10/2021   Scalp irritation 03/10/2021   Bipolar disorder ([place]    Attention deficit hyperactivity disorder (ADHD), predominantly  inattentive type 04/01/2020   Tinea capitis 11/10/2018   Raynaud's phenomenon without gangrene 08/08/2018   Pain in left acromioclavicular joint 08/08/2018   History of testicular mass 08/08/2018   Bipolar II disorder ([place] 08/08/2018   Generalized anxiety disorder     PHYSICAL THERAPY DISCHARGE SUMMARY  Visits from Start of Care: 6  Current functional level related to goals / functional outcomes: See above   Remaining deficits: See above  Education / Equipment: HEP, Ed for management of pain   Patient agrees to discharge. Patient goals were met. Patient is being discharged due to being pleased with the current functional level.  Gar Ponto MS, PT 05/20/21 6:18 PM   [facility] [address] [place], [place], [address] Phone: [phone]   Fax:  [phone]  Name: Eric Townsend MRN: [id number] Date of Birth: 1990/06/20

## 2021-05-21 ENCOUNTER — Encounter (HOSPITAL_COMMUNITY): Payer: Self-pay | Admitting: Psychiatry

## 2021-05-21 ENCOUNTER — Telehealth (INDEPENDENT_AMBULATORY_CARE_PROVIDER_SITE_OTHER): Payer: 59 | Admitting: Psychiatry

## 2021-05-21 DIAGNOSIS — F900 Attention-deficit hyperactivity disorder, predominantly inattentive type: Secondary | ICD-10-CM | POA: Diagnosis not present

## 2021-05-21 DIAGNOSIS — F1290 Cannabis use, unspecified, uncomplicated: Secondary | ICD-10-CM | POA: Diagnosis not present

## 2021-05-21 DIAGNOSIS — F3181 Bipolar II disorder: Secondary | ICD-10-CM

## 2021-05-21 MED ORDER — LAMOTRIGINE 100 MG PO TABS: 100.0000 mg | ORAL_TABLET | Freq: Every day | ORAL | 3 refills | Status: DC

## 2021-05-21 MED ORDER — BUPROPION HCL ER (XL) 300 MG PO TB24: 300.0000 mg | ORAL_TABLET | ORAL | 3 refills | Status: DC

## 2021-05-21 MED ORDER — TRAZODONE HCL 100 MG PO TABS: 100.0000 mg | ORAL_TABLET | Freq: Every day | ORAL | 3 refills | Status: DC

## 2021-05-21 NOTE — Progress Notes (Signed)
BH MD/PA/NP OP Progress Note Virtual Visit via Video Note  I connected with Eric Townsend on 05/21/21 at 10:00 AM EST by a video enabled telemedicine application and verified that I am speaking with the correct person using two identifiers.  Location: Patient: Home Provider: Clinic   I discussed the limitations of evaluation and management by telemedicine and the availability of in person appointments. The patient expressed understanding and agreed to proceed.  I provided 30 minutes of non-face-to-face time during this encounter.   05/21/2021 12:24 PM Eric Townsend  MRN:  [id number]  Chief Complaint: "I have been down and impulsive"     HPI: 30 year old male seen today for follow up psychiatric evaluation.  He psychiatric history of anxiety, depression, ADHD, and bipolar 2 disorder.  He is currently managed on Lamictal 100 daily, trazodone 100 mg nightly, and Wellbutrin 300 mg every morning.  He notes that his other medications are effective in managing his psychiatric conditions.    Today he is well-groomed, pleasant, cooperative,and engaged in conversation.  Patient notes that recently has been down and impulsive. He also notes that he has been more distracted and been having racing thoughts. He denied other symptoms of mania. Patient notes that at times he wants to cheat on his girlfriend. Recently he notes that he kissed another male out of impulse but denies sexually inappropriate behaviors. He does note that his consumption of marijuana has increased. Provider informed patient that marijuana can alter his mood and increase impulses. Provider recommended reducing/discontinuing his consumption. He  endorsed understanding.  Patient reports that finances has also been a stressor. 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 Today provider conducted a GAD 7 and patient scored a 12, at his last visit he  scored an 8.  Provider also conducted a PHQ 9 and patient scored a 13, at his last visit he scored an 8. He reports that he thinks about his deceased father this time a year, He endorses adequate sleep and appetite.  He endorses passive SI but denies wanting to harm himself. He denies SI/HI/VAH or paranoia.  Patient notes at times his legs feel restless. Provider instructed patient to see his PCP for further evaluation.   At this time patient request that his medications not be adjusted. Provider encouraged patient to reduce/discontinue marijuana to help with mood stabilization and impulse control. Provider also discussed increasing Lamictal if mood does not stabilize. Patient will follow up with counseling at [facility] counseling.  No other concerns noted at this time.   Visit Diagnosis:    ICD-10-CM   1. Marijuana use  F12.90     2. Bipolar II disorder (HCC)  F31.81 buPROPion (WELLBUTRIN XL) 300 MG 24 hr tablet    lamoTRIgine (LAMICTAL) 100 MG tablet    traZODone (DESYREL) 100 MG tablet    3. Attention deficit hyperactivity disorder (ADHD), predominantly inattentive type  F90.0 buPROPion (WELLBUTRIN XL) 300 MG 24 hr tablet      Past Psychiatric History:  anxiety, depression, ADHD, and bipolar 2 disorder  Past Medical History:  Past Medical History:  Diagnosis Date   Bipolar disorder (HCC)    Generalized anxiety disorder    Was seen at [facility] for this or [employer] student counseling services. Has been on mediction:  Zoloft 100 mg--doesn't like, Prozac 40 mg   Testicular torsion     Past Surgical History:  Procedure Laterality Date  SURGERY SCROTAL / TESTICULAR Bilateral 05/2014   Removed left varicocele and performed bilateral orchiopexy    Family Psychiatric History: Father anxiety and depression. Mother anxiety  Family History:  Family History  Problem Relation Age of Onset   Obesity Mother    Other Mother        Prediabetes   Kidney disease Father    Diabetes Father     Obesity Father    Hyperlipidemia Father    Depression Father        and Anxiety   Other Father 3       COVID    Social History:  Social History   Socioeconomic History   Marital status: Media planner    Spouse name: Albin Felling   Number of children: 0   Years of education: college grad sociology   Highest education level: Not on file  Occupational History   Occupation: Academic librarian.  Tobacco Use   Smoking status: Former    Packs/day: 0.25    Years: 9.00    Pack years: 2.25    Types: Cigarettes    Quit date: 07/16/2018    Years since quitting: 2.8   Smokeless tobacco: Never  Substance and Sexual Activity   Alcohol use: Yes    Comment: History of too much, but now just occasional.   Drug use: Yes    Types: Marijuana   Sexual activity: Yes    Birth control/protection: Condom  Other Topics Concern   Not on file  Social History Narrative   Currently not working since 12/2019   Lives with long term girlfriend, Albin Felling, who is a high Engineer, site.   Social Determinants of Health   Financial Resource Strain: Not on file  Food Insecurity: Not on file  Transportation Needs: Not on file  Physical Activity: Not on file  Stress: Not on file  Social Connections: Not on file    Allergies: No Known Allergies  Metabolic Disorder Labs: No results found for: HGBA1C, MPG No results found for: PROLACTIN Lab Results  Component Value Date   CHOL 228 (H) 05/30/2020   TRIG 68 05/30/2020   HDL 109 05/30/2020   LDLCALC 107 (H) 05/30/2020   LDLCALC 70 10/05/2018   No results found for: TSH  Therapeutic Level Labs: No results found for: LITHIUM No results found for: VALPROATE No components found for:  CBMZ  Current Medications: Current Outpatient Medications  Medication Sig Dispense Refill   buPROPion (WELLBUTRIN XL) 300 MG 24 hr tablet Take 1 tablet (300 mg total) by mouth every morning. 30 tablet 3   lamoTRIgine (LAMICTAL) 100 MG tablet Take 1 tablet (100 mg total)  by mouth daily. 30 tablet 3   terbinafine (LAMISIL) 250 MG tablet Take 1 tablet (250 mg total) by mouth daily. 56 tablet 0   traZODone (DESYREL) 100 MG tablet Take 1 tablet (100 mg total) by mouth at bedtime. 30 tablet 3   No current facility-administered medications for this visit.     Musculoskeletal: Strength & Muscle Tone: within normal limits Gait & Station: normal Patient leans: N/A  Psychiatric Specialty Exam: Review of Systems  There were no vitals taken for this visit.There is no height or weight on file to calculate BMI.  General Appearance: Well Groomed  Eye Contact:  Good  Speech:  Clear and Coherent and Pressured  Volume:  Normal  Mood:  Euthymic  Affect:  Appropriate  Thought Process:  Coherent, Goal Directed and Linear  Orientation:  Full (Time, Place, and  Person)  Thought Content: WDL and Logical   Suicidal Thoughts:  No  Homicidal Thoughts:  No  Memory:  Immediate;   Good Recent;   Good Remote;   Good  Judgement:  Good  Insight:  Good  Psychomotor Activity:  Normal  Concentration:  Concentration: Fair and Attention Span: Fair  Recall:  Good  Fund of Knowledge: Good  Language: Good  Akathisia:  No  Handed:  Right  AIMS (if indicated): Not done  Assets:  Communication Skills Desire for Improvement Housing Intimacy Leisure Time Physical Health Social Support  ADL's:  Intact  Cognition: WNL  Sleep:  Good   Screenings: GAD-7    Flowsheet Row Video Visit from 05/21/2021 in [facility] Clinical Support from 02/18/2021 in [facility] Clinical Support from 11/20/2020 in [facility] Clinical Support from 06/04/2020 in [facility] Clinical Support from 05/05/2020 in [facility]  Total GAD-7 Score 12 8 9 8 9       PHQ2-9    Flowsheet Row Video Visit from 05/21/2021 in [facility]  Clinical Support from 02/18/2021 in [facility] Clinical Support from 11/20/2020 in [facility] Clinical Support from 06/04/2020 in [facility] Clinical Support from 05/05/2020 in [facility]  PHQ-2 Total Score 4 2 1 3 2   PHQ-9 Total Score 13 8 10 7 10       Flowsheet Row Video Visit from 05/21/2021 in [facility] Clinical Support from 02/18/2021 in [facility] Clinical Support from 11/20/2020 in [facility]  C-SSRS RISK CATEGORY Error: Q7 should not be populated when Q6 is No Error: Q7 should not be populated when Q6 is No Error: Q7 should not be populated when Q6 is No      Assessment and Plan: Patient endorses symptoms of anxiety, depression, and hypomania. At this time patient request that his medications not be adjusted. Provider encouraged patient to reduce/discontinue marijuana to help with mood stabilization and impulse control. Provider also discussed increasing Lamictal if mood does not stabilize.  1. Bipolar II disorder (HCC)  Continue- buPROPion (WELLBUTRIN XL) 300 MG 24 hr tablet; Take 1 tablet (300 mg total) by mouth every morning.  Dispense: 30 tablet; Refill: 3 Continue- lamoTRIgine (LAMICTAL) 100 MG tablet; Take 1 tablet (100 mg total) by mouth daily.  Dispense: 30 tablet; Refill: 3 Continue- traZODone (DESYREL) 100 MG tablet; Take 1 tablet (100 mg total) by mouth at bedtime.  Dispense: 30 tablet; Refill: 3  2. Attention deficit hyperactivity disorder (ADHD), predominantly inattentive type  Continue- buPROPion (WELLBUTRIN XL) 300 MG 24 hr tablet; Take 1 tablet (300 mg total) by mouth every morning.  Dispense: 30 tablet; Refill: 3  3. Marijuana use    Follow-up in 3 months Follow-up with therapy  [facility], NP 05/21/2021, 12:24 PM

## 2021-05-28 LAB — FUNGUS CULTURE W SMEAR

## 2021-06-10 ENCOUNTER — Encounter: Payer: Self-pay | Admitting: Physician Assistant

## 2021-07-06 ENCOUNTER — Ambulatory Visit (INDEPENDENT_AMBULATORY_CARE_PROVIDER_SITE_OTHER): Payer: Self-pay | Admitting: Internal Medicine

## 2021-07-06 ENCOUNTER — Encounter: Payer: Self-pay | Admitting: Internal Medicine

## 2021-07-06 ENCOUNTER — Other Ambulatory Visit: Payer: Self-pay

## 2021-07-06 VITALS — BP 102/62 | HR 80 | Resp 12 | Ht 74.0 in | Wt 167.0 lb

## 2021-07-06 DIAGNOSIS — R197 Diarrhea, unspecified: Secondary | ICD-10-CM

## 2021-07-06 DIAGNOSIS — F319 Bipolar disorder, unspecified: Secondary | ICD-10-CM

## 2021-07-06 DIAGNOSIS — L089 Local infection of the skin and subcutaneous tissue, unspecified: Secondary | ICD-10-CM

## 2021-07-06 MED ORDER — METAMUCIL SMOOTH TEXTURE 58.6 % PO POWD: ORAL | 12 refills | Status: DC

## 2021-07-06 NOTE — Progress Notes (Signed)
° ° °  Subjective:    Patient ID: Eric Townsend, male   DOB: 1990/11/07, 31 y.o.   MRN: [id number]   HPI   Scalp inflammation:  Finished the 56 day supply of Terbinafine a few days ago.  Feels his scalp is better.  He ultimately grew and epicoccus species, which is a mold reportedly not felt to be a human pathogen.  Unable to find info regarding treatment in medical reference material.    2.  Loose stools:  brings in an [employer] food sensitivity testing--tests for IgG to different foods.  Discussed this would just let him know he has been exposed to the food, but not necessarily that he is allergic to it.  He is seeing a GI PA with [place].  Working diagnosis is likely IBS.  He is not necessarily looking at fiber in his diet, though describes a healthy diet.  Eats a lot of bananas, which do not have a lot of fiber.  Has not spoken with his counselor about the possibility of IBS and internalizing stress.  3.  Bipolar Disorder:  working with [employer].  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  Struggling with depression duee to this.  Current Meds  Medication Sig   buPROPion (WELLBUTRIN XL) 300 MG 24 hr tablet Take 1 tablet (300 mg total) by mouth every morning.   lamoTRIgine (LAMICTAL) 100 MG tablet Take 1 tablet (100 mg total) by mouth daily.   traZODone (DESYREL) 100 MG tablet Take 1 tablet (100 mg total) by mouth at bedtime.   No Known Allergies   Review of Systems    Objective:   BP 102/62 (BP Location: Left Arm, Patient Position: Sitting, Cuff Size: Normal)    Pulse 80    Resp 12    Ht $R'6\' 2"'ag$  (1.88 m)    Wt 167 lb (75.8 kg)    BMI 21.44 kg/m   Physical Exam HEENT:  Right scalp still with mainly mild perifollicular inflammation and scant flaking as well as hair loss.   Lungs:  CTA CV:  RRR without murmur or rub.     Assessment & Plan    Scalp inflammation with growth of epicoccus sp of mold, apparently not a human pathogen, felt to be  contaminant.  Check with ID regarding mold from scalp scraping.  Not adequately treating his scalp issue.  Referral to Derm  2.  IBS:  discussed we can do the food sensitivity kit he brought in, but do not believe it is helpful in documenting true food allergies as just testing for IgG to foods.  May be limiting his diet unnecessarily.  Also, would like for him to gradually add more fiber with Metamucil to diet and see if helps.    3.  Bipolar Disorder, more depression dominant recently:  would like some help with job finding.  Will send to [facility] at [facility].  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  Has been reportedly told does not have enough experience for positions he is applying.

## 2021-08-06 ENCOUNTER — Encounter (HOSPITAL_COMMUNITY): Payer: No Payment, Other | Admitting: Psychiatry

## 2021-08-13 ENCOUNTER — Telehealth (INDEPENDENT_AMBULATORY_CARE_PROVIDER_SITE_OTHER): Payer: No Payment, Other | Admitting: Psychiatry

## 2021-08-13 DIAGNOSIS — F3181 Bipolar II disorder: Secondary | ICD-10-CM

## 2021-08-13 DIAGNOSIS — F900 Attention-deficit hyperactivity disorder, predominantly inattentive type: Secondary | ICD-10-CM | POA: Diagnosis not present

## 2021-08-13 MED ORDER — BUPROPION HCL ER (XL) 300 MG PO TB24: 300.0000 mg | ORAL_TABLET | ORAL | 2 refills | Status: DC

## 2021-08-13 MED ORDER — LAMOTRIGINE 100 MG PO TABS: 100.0000 mg | ORAL_TABLET | Freq: Every day | ORAL | 2 refills | Status: DC

## 2021-08-13 MED ORDER — TRAZODONE HCL 100 MG PO TABS: 100.0000 mg | ORAL_TABLET | Freq: Every day | ORAL | 2 refills | Status: DC

## 2021-08-13 NOTE — Progress Notes (Signed)
BH MD/PA/NP OP Progress Note ? ?08/13/2021 11:41 AM ?Glennon Mac  ?MRN:  [id number] ? ?Virtual Visit via Video Note ? ?I connected with Glennon Mac on 08/13/21 at 11:00 AM EDT by a video enabled telemedicine application and verified that I am speaking with the correct person using two identifiers. ? ?Location: ?Patient: home ?Provider: off site ?  ?I discussed the limitations of evaluation and management by telemedicine and the availability of in person appointments. The patient expressed understanding and agreed to proceed. ? ?  ?I discussed the assessment and treatment plan with the patient. The patient was provided an opportunity to ask questions and all were answered. The patient agreed with the plan and demonstrated an understanding of the instructions. ?  ?The patient was advised to call back or seek an in-person evaluation if the symptoms worsen or if the condition fails to improve as anticipated. ? ?I provided 20 minutes of non-face-to-face time during this encounter. ? ? ?Mcneil Sober, NP  ? ?Chief Complaint: Medication management ? ?HPI: Yaasir Menken is a 31 year old male presenting to [facility] behavioral health outpatient for a follow-up psychiatric evaluation.  He has a psychiatric history of generalized anxiety disorder, bipolar disorder, ADHD.  His symptoms are managed with bupropion 300 mg, Lamictal 100 mg and trazodone 100 mg at bedtime.  Patient reports medication compliance but states he is experiencing erectile dysfunction.  Medication education conducted and patient acknowledged understanding that Wellbutrin is less likely than his previous antidepressants to cause ED.  Patient prefers to remain on his current dosage of Wellbutrin.  No medication changes today. ?Patient is alert and oriented x4, calm, pleasant and willing to engage.  He is well-groomed and appears dressed appropriately for the weather.  Patient denies suicidal or homicidal ideations, paranoia, delusional thought,  auditory or visual hallucinations. ? ?Visit Diagnosis:  ?  ICD-10-CM   ?1. Bipolar II disorder (HCC)  F31.81   ?  ?2. Attention deficit hyperactivity disorder (ADHD), predominantly inattentive type  F90.0   ?  ? ? ?Past Psychiatric History:  generalized anxiety disorder, bipolar disorder, ADHD ? ?Past Medical History:  ?Past Medical History:  ?Diagnosis Date  ? Bipolar disorder (HCC)   ? Generalized anxiety disorder   ? Was seen at [facility] for this or [facility] student counseling services. Has been on mediction:  Zoloft 100 mg--doesn't like, Prozac 40 mg  ? Testicular torsion   ?  ?Past Surgical History:  ?Procedure Laterality Date  ? SURGERY SCROTAL / TESTICULAR Bilateral 05/2014  ? Removed left varicocele and performed bilateral orchiopexy  ? ? ?Family Psychiatric History: None known ? ?Family History:  ?Family History  ?Problem Relation Age of Onset  ? Obesity Mother   ? Other Mother   ?     Prediabetes  ? Kidney disease Father   ? Diabetes Father   ? Obesity Father   ? Hyperlipidemia Father   ? Depression Father   ?     and Anxiety  ? Other Father 69  ?     COVID  ? ? ?Social History:  ?Social History  ? ?Socioeconomic History  ? Marital status: Media planner  ?  Spouse name: Albin Felling  ? Number of children: 0  ? Years of education: college grad sociology  ? Highest education level: Not on file  ?Occupational History  ? Occupation: Academic librarian.  ?Tobacco Use  ? Smoking status: Former  ?  Packs/day: 0.25  ?  Years: 9.00  ?  Pack  years: 2.25  ?  Types: Cigarettes  ?  Quit date: 07/16/2018  ?  Years since quitting: 3.0  ? Smokeless tobacco: Never  ?Substance and Sexual Activity  ? Alcohol use: Yes  ?  Comment: History of too much, but now just occasional.  ? Drug use: Yes  ?  Types: Marijuana  ? Sexual activity: Yes  ?  Birth control/protection: Condom  ?Other Topics Concern  ? Not on file  ?Social History Narrative  ? Currently not working since 12/2019  ? Lives with long term girlfriend, Albin Felling, who is a high  Engineer, site.  ? ?Social Determinants of Health  ? ?Financial Resource Strain: Not on file  ?Food Insecurity: Not on file  ?Transportation Needs: Not on file  ?Physical Activity: Not on file  ?Stress: Not on file  ?Social Connections: Not on file  ? ? ?Allergies: No Known Allergies ? ?Metabolic Disorder Labs: ?No results found for: HGBA1C, MPG ?No results found for: PROLACTIN ?Lab Results  ?Component Value Date  ? CHOL 228 (H) 05/30/2020  ? TRIG 68 05/30/2020  ? HDL 109 05/30/2020  ? LDLCALC 107 (H) 05/30/2020  ? LDLCALC 70 10/05/2018  ? ?No results found for: TSH ? ?Therapeutic Level Labs: ?No results found for: LITHIUM ?No results found for: VALPROATE ?No components found for:  CBMZ ? ?Current Medications: ?Current Outpatient Medications  ?Medication Sig Dispense Refill  ? buPROPion (WELLBUTRIN XL) 300 MG 24 hr tablet Take 1 tablet (300 mg total) by mouth every morning. 30 tablet 3  ? lamoTRIgine (LAMICTAL) 100 MG tablet Take 1 tablet (100 mg total) by mouth daily. 30 tablet 3  ? psyllium (METAMUCIL SMOOTH TEXTURE) 58.6 % powder 1/2 dose daily in 8 oz water. 283 g 12  ? terbinafine (LAMISIL) 250 MG tablet Take 1 tablet (250 mg total) by mouth daily. (Patient not taking: Reported on 07/06/2021) 56 tablet 0  ? traZODone (DESYREL) 100 MG tablet Take 1 tablet (100 mg total) by mouth at bedtime. 30 tablet 3  ? ?No current facility-administered medications for this visit.  ? ? ? ?Musculoskeletal: ?Strength & Muscle Tone: n/a virtual visit ?Gait & Station:  n/a ?Patient leans: N/A ? ?Psychiatric Specialty Exam: ?Review of Systems  ?Psychiatric/Behavioral:  Negative for hallucinations, self-injury and suicidal ideas.   ?All other systems reviewed and are negative.  ?There were no vitals taken for this visit.There is no height or weight on file to calculate BMI.  ?General Appearance: Well Groomed  ?Eye Contact:  Good  ?Speech:  Clear and Coherent  ?Volume:  Normal  ?Mood:  Euthymic  ?Affect: congruent  ?Thought Process:   Goal Directed  ?Orientation:  Full (Time, Place, and Person)  ?Thought Content: Logical   ?Suicidal Thoughts:  No  ?Homicidal Thoughts:  No  ?Memory:  Immediate;   Good  ?Judgement:  Good  ?Insight:  Good  ?Psychomotor Activity:  Normal  ?Concentration:  Concentration: Good  ?Recall:  Good  ?Fund of Knowledge: Good  ?Language: Good  ?Akathisia:  NA  ?Handed:  Right  ?AIMS (if indicated): not done  ?Assets:  Communication Skills ?Desire for Improvement  ?ADL's:  Intact  ?Cognition: WNL  ?Sleep:  Good  ? ?Screenings: ?GAD-7   ? ?Flowsheet Row Video Visit from 05/21/2021 in [facility] Clinical Support from 02/18/2021 in [facility] Clinical Support from 11/20/2020 in [facility] Clinical Support from 06/04/2020 in [facility]  ?Total GAD-7 Score 12 8 9 8 9   ? ?  ? ?PHQ2-9   ? ?Flowsheet Row [facility]  ?PHQ-2 Total Score 4 2 1 3 2   ?PHQ-9 Total Score 13 8 10 7 10   ? ?  ? ?Flowsheet Row [facility]  ?C-SSRS RISK CATEGORY Error: Q7 should not be populated when Q6 is No Error: Q7 should not be populated when Q6 is No Error: Q7 should not be populated when Q6 is No  ? ?  ? ? ? ?Assessment and Plan: Oretha EllisBrandon Bos is a 31 year [facility] for a follow-up psychiatric evaluation.  He has a psychiatric history of generalized anxiety disorder, bipolar disorder, ADHD.  His symptoms are managed with bupropion 300 mg, Lamicta

## 2021-08-17 ENCOUNTER — Telehealth (HOSPITAL_COMMUNITY): Payer: No Payment, Other | Admitting: Psychiatry

## 2021-11-05 ENCOUNTER — Telehealth (INDEPENDENT_AMBULATORY_CARE_PROVIDER_SITE_OTHER): Payer: No Payment, Other | Admitting: Psychiatry

## 2021-11-05 ENCOUNTER — Encounter (HOSPITAL_COMMUNITY): Payer: Self-pay | Admitting: Psychiatry

## 2021-11-05 DIAGNOSIS — F900 Attention-deficit hyperactivity disorder, predominantly inattentive type: Secondary | ICD-10-CM | POA: Diagnosis not present

## 2021-11-05 DIAGNOSIS — F3181 Bipolar II disorder: Secondary | ICD-10-CM | POA: Diagnosis not present

## 2021-11-05 MED ORDER — LAMOTRIGINE 100 MG PO TABS: 100.0000 mg | ORAL_TABLET | Freq: Every day | ORAL | 3 refills | Status: DC

## 2021-11-05 MED ORDER — TRAZODONE HCL 100 MG PO TABS: 100.0000 mg | ORAL_TABLET | Freq: Every day | ORAL | 3 refills | Status: DC

## 2021-11-05 MED ORDER — BUPROPION HCL ER (XL) 300 MG PO TB24: 300.0000 mg | ORAL_TABLET | ORAL | 3 refills | Status: DC

## 2021-11-05 NOTE — Progress Notes (Signed)
BH MD/PA/NP OP Progress Note Virtual Visit via Video Note  I connected with Eric Townsend on 11/05/21 at  9:30 AM EDT by a video enabled telemedicine application and verified that I am speaking with the correct person using two identifiers.  Location: Patient: Home Provider: Clinic   I discussed the limitations of evaluation and management by telemedicine and the availability of in person appointments. The patient expressed understanding and agreed to proceed.  I provided 30 minutes of non-face-to-face time during this encounter.   11/05/2021 9:55 AM Eric Townsend  MRN:  [id number]  Chief Complaint: "I have been depressed because of not having a job"     HPI: 31 year old male seen today for follow up psychiatric evaluation.  He psychiatric history of anxiety, depression, ADHD, and bipolar 2 disorder.  He is currently managed on Lamictal 100 daily, trazodone 100 mg nightly, and Wellbutrin 300 mg every morning.  He notes that his other medications are effective in managing his psychiatric conditions.    Today he is well-groomed, pleasant, cooperative,and engaged in conversation.  Patient notes that recently has been more depressed because he does not have a job. He did Archivist that he [facility] and is looking forward to what they have to offer. He asked provider to write a letter detailing his metal health. Provider was agreeable to this.   Patient notes that his mood is more stable and denies manic like behaviors. To help manage feelings of depression patient notes that he has been doing wood working with a friend. He also notes he has been reading. He endorses adequate sleep and appetite. Today he denies SI/HI/VAH or paranoia.  Patient notes that he has reduced his marijuana consumption. He notes that he eats delta 8 gumminess daily and reports that it helps him sleep.  Patient reports that he has a new counselor he does not feel as comfortable with. He  notes that he misses's his sex therapist as he feels that he can not relate to his new therapist. He reports that he and his girlfriend are having intimacy issues at times.  At this time no medication changes made today. Patient will follow up with counseling at [facility] counseling.  No other concerns noted at this time.   Visit Diagnosis:    ICD-10-CM   1. Bipolar II disorder (HCC)  F31.81     2. Attention deficit hyperactivity disorder (ADHD), predominantly inattentive type  F90.0       Past Psychiatric History:  anxiety, depression, ADHD, and bipolar 2 disorder  Past Medical History:  Past Medical History:  Diagnosis Date   Bipolar disorder (HCC)    Generalized anxiety disorder    Was seen at [facility] for this or [employer] student counseling services. Has been on mediction:  Zoloft 100 mg--doesn't like, Prozac 40 mg   Testicular torsion     Past Surgical History:  Procedure Laterality Date   SURGERY SCROTAL / TESTICULAR Bilateral 05/2014   Removed left varicocele and performed bilateral orchiopexy    Family Psychiatric History: Father anxiety and depression. Mother anxiety  Family History:  Family History  Problem Relation Age of Onset   Obesity Mother    Other Mother        Prediabetes   Kidney disease Father    Diabetes Father    Obesity Father    Hyperlipidemia Father    Depression Father        and Anxiety   Other Father 64  COVID    Social History:  Social History   Socioeconomic History   Marital status: Media planner    Spouse name: Albin Felling   Number of children: 0   Years of education: college grad sociology   Highest education level: Not on file  Occupational History   Occupation: Academic librarian.  Tobacco Use   Smoking status: Former    Packs/day: 0.25    Years: 9.00    Total pack years: 2.25    Types: Cigarettes    Quit date: 07/16/2018    Years since quitting: 3.3   Smokeless tobacco: Never  Substance and Sexual Activity   Alcohol use:  Yes    Comment: History of too much, but now just occasional.   Drug use: Yes    Types: Marijuana   Sexual activity: Yes    Birth control/protection: Condom  Other Topics Concern   Not on file  Social History Narrative   Currently not working since 12/2019   Lives with long term girlfriend, Albin Felling, who is a high Engineer, site.   Social Determinants of Health   Financial Resource Strain: Not on file  Food Insecurity: Not on file  Transportation Needs: Not on file  Physical Activity: Not on file  Stress: Not on file  Social Connections: Not on file    Allergies: No Known Allergies  Metabolic Disorder Labs: No results found for: "HGBA1C", "MPG" No results found for: "PROLACTIN" Lab Results  Component Value Date   CHOL 228 (H) 05/30/2020   TRIG 68 05/30/2020   HDL 109 05/30/2020   LDLCALC 107 (H) 05/30/2020   LDLCALC 70 10/05/2018   No results found for: "TSH"  Therapeutic Level Labs: No results found for: "LITHIUM" No results found for: "VALPROATE" No results found for: "CBMZ"  Current Medications: Current Outpatient Medications  Medication Sig Dispense Refill   buPROPion (WELLBUTRIN XL) 300 MG 24 hr tablet Take 1 tablet (300 mg total) by mouth every morning. 30 tablet 2   lamoTRIgine (LAMICTAL) 100 MG tablet Take 1 tablet (100 mg total) by mouth daily. 30 tablet 2   psyllium (METAMUCIL SMOOTH TEXTURE) 58.6 % powder 1/2 dose daily in 8 oz water. 283 g 12   terbinafine (LAMISIL) 250 MG tablet Take 1 tablet (250 mg total) by mouth daily. (Patient not taking: Reported on 07/06/2021) 56 tablet 0   traZODone (DESYREL) 100 MG tablet Take 1 tablet (100 mg total) by mouth at bedtime. 30 tablet 2   No current facility-administered medications for this visit.     Musculoskeletal: Strength & Muscle Tone: within normal limits, telehealth visit Gait & Station: normal, telehealth visit Patient leans: N/A  Psychiatric Specialty Exam: Review of Systems  There were no vitals  taken for this visit.There is no height or weight on file to calculate BMI.  General Appearance: Well Groomed  Eye Contact:  Good  Speech:  Clear and Coherent and Pressured  Volume:  Normal  Mood:  Euthymic, depressed due to not having job but notes he can cope  Affect:  Appropriate  Thought Process:  Coherent, Goal Directed and Linear  Orientation:  Full (Time, Place, and Person)  Thought Content: WDL and Logical   Suicidal Thoughts:  No  Homicidal Thoughts:  No  Memory:  Immediate;   Good Recent;   Good Remote;   Good  Judgement:  Good  Insight:  Good  Psychomotor Activity:  Normal  Concentration:  Concentration: Good and Attention Span: Good  Recall:  Good  Fund  of Knowledge: Good  Language: Good  Akathisia:  No  Handed:  Right  AIMS (if indicated): Not done  Assets:  Communication Skills Desire for Improvement Housing Intimacy Leisure Time Physical Health Social Support  ADL's:  Intact  Cognition: WNL  Sleep:  Good   Screenings: GAD-7    Flowsheet Row Video Visit from 05/21/2021 in [facility] Clinical Support from 02/18/2021 in [facility] Clinical Support from 11/20/2020 in [facility] Clinical Support from 06/04/2020 in [facility] Clinical Support from 05/05/2020 in [facility]  Total GAD-7 Score 12 8 9 8 9       PHQ2-9    Flowsheet Row Video Visit from 05/21/2021 in [facility] Clinical Support from 02/18/2021 in [facility] Clinical Support from 11/20/2020 in [facility] Clinical Support from 06/04/2020 in [facility] Clinical Support from 05/05/2020 in [facility]  PHQ-2 Total Score 4 2 1 3 2   PHQ-9 Total Score 13 8 10 7 10       Flowsheet Row Video Visit from 05/21/2021 in  [facility] Clinical Support from 02/18/2021 in [facility] Clinical Support from 11/20/2020 in [facility]  C-SSRS RISK CATEGORY Error: Q7 should not be populated when Q6 is No Error: Q7 should not be populated when Q6 is No Error: Q7 should not be populated when Q6 is No      Assessment and Plan: Patient endorses situational depression due to not having a job. He however notes that he is able to cope with it. No medication changes made today. Patient agreeable to continue medications as prescribed.    1. Bipolar II disorder (HCC)  Continue- buPROPion (WELLBUTRIN XL) 300 MG 24 hr tablet; Take 1 tablet (300 mg total) by mouth every morning.  Dispense: 30 tablet; Refill: 3 Continue- lamoTRIgine (LAMICTAL) 100 MG tablet; Take 1 tablet (100 mg total) by mouth daily.  Dispense: 30 tablet; Refill: 3 Continue- traZODone (DESYREL) 100 MG tablet; Take 1 tablet (100 mg total) by mouth at bedtime.  Dispense: 30 tablet; Refill: 3  2. Attention deficit hyperactivity disorder (ADHD), predominantly inattentive type  Continue- buPROPion (WELLBUTRIN XL) 300 MG 24 hr tablet; Take 1 tablet (300 mg total) by mouth every morning.  Dispense: 30 tablet; Refill: 3  3. Marijuana use    Follow-up in 3 months Follow-up with therapy  [facility], NP 11/05/2021, 9:55 AM

## 2021-12-17 ENCOUNTER — Other Ambulatory Visit: Payer: Self-pay

## 2021-12-22 ENCOUNTER — Encounter: Payer: Self-pay | Admitting: Internal Medicine

## 2022-01-27 ENCOUNTER — Encounter (HOSPITAL_COMMUNITY): Payer: Self-pay | Admitting: Psychiatry

## 2022-01-27 ENCOUNTER — Telehealth (INDEPENDENT_AMBULATORY_CARE_PROVIDER_SITE_OTHER): Payer: No Payment, Other | Admitting: Psychiatry

## 2022-01-27 DIAGNOSIS — F3181 Bipolar II disorder: Secondary | ICD-10-CM

## 2022-01-27 DIAGNOSIS — F900 Attention-deficit hyperactivity disorder, predominantly inattentive type: Secondary | ICD-10-CM

## 2022-01-27 MED ORDER — TRAZODONE HCL 100 MG PO TABS: 100.0000 mg | ORAL_TABLET | Freq: Every day | ORAL | 3 refills | Status: DC

## 2022-01-27 MED ORDER — LAMOTRIGINE 100 MG PO TABS: 100.0000 mg | ORAL_TABLET | Freq: Every day | ORAL | 3 refills | Status: DC

## 2022-01-27 MED ORDER — BUPROPION HCL ER (XL) 300 MG PO TB24: 300.0000 mg | ORAL_TABLET | ORAL | 3 refills | Status: DC

## 2022-01-27 NOTE — Progress Notes (Signed)
BH MD/PA/NP OP Progress Note Virtual Visit via Video Note  I connected with Eric Townsend on 01/27/22 at 10:30 AM EDT by a video enabled telemedicine application and verified that I am speaking with the correct person using two identifiers.  Location: Patient: Home Provider: Clinic   I discussed the limitations of evaluation and management by telemedicine and the availability of in person appointments. The patient expressed understanding and agreed to proceed.  I provided 30 minutes of non-face-to-face time during this encounter.   01/27/2022 12:53 PM Eric Townsend  MRN:  [id number]  Chief Complaint: "My mood is better"     HPI: 31 year old male seen today for follow up psychiatric evaluation.  He psychiatric history of anxiety, depression, ADHD, and bipolar 2 disorder.  He is currently managed on Lamictal 100 daily, trazodone 100 mg nightly, and Wellbutrin 300 mg every morning.  He notes that his other medications are effective in managing his psychiatric conditions.    Today he is well-groomed, pleasant, cooperative,and engaged in conversation.  Patient notes that recently his mood has been better. He notes that he has been working at [employer]. He also notes that he has been working with [facility]. 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 Patient notes that his mood is stable and reports that he has minimal anxiety and depression Today he endorses adequate sleep and appetite. Today he denies SI/HI/VAH or paranoia.  At this time no medication changes made today. Patient will follow up with counseling at [facility] counseling.  No other concerns noted at this time.   Visit Diagnosis:    ICD-10-CM   1. Bipolar II disorder (HCC)  F31.81 buPROPion (WELLBUTRIN XL) 300 MG 24 hr tablet    lamoTRIgine (LAMICTAL) 100 MG tablet    traZODone (DESYREL) 100 MG tablet    2. Attention deficit hyperactivity disorder (ADHD), predominantly inattentive type  F90.0 buPROPion  (WELLBUTRIN XL) 300 MG 24 hr tablet      Past Psychiatric History:  anxiety, depression, ADHD, and bipolar 2 disorder  Past Medical History:  Past Medical History:  Diagnosis Date   Bipolar disorder (HCC)    Generalized anxiety disorder    Was seen at [facility] for this or [employer] student counseling services. Has been on mediction:  Zoloft 100 mg--doesn't like, Prozac 40 mg   Testicular torsion     Past Surgical History:  Procedure Laterality Date   SURGERY SCROTAL / TESTICULAR Bilateral 05/2014   Removed left varicocele and performed bilateral orchiopexy    Family Psychiatric History: Father anxiety and depression. Mother anxiety  Family History:  Family History  Problem Relation Age of Onset   Obesity Mother    Other Mother        Prediabetes   Kidney disease Father    Diabetes Father    Obesity Father    Hyperlipidemia Father    Depression Father        and Anxiety   Other Father 50       COVID    Social History:  Social History   Socioeconomic History   Marital status: Media planner    Spouse name: Eric Townsend   Number of children: 0   Years of education: college grad sociology   Highest education level: Not on file  Occupational History   Occupation: Academic librarian.  Tobacco Use   Smoking status: Former    Packs/day: 0.25    Years: 9.00    Total pack years: 2.25  Types: Cigarettes    Quit date: 07/16/2018    Years since quitting: 3.5   Smokeless tobacco: Never  Substance and Sexual Activity   Alcohol use: Yes    Comment: History of too much, but now just occasional.   Drug use: Yes    Types: Marijuana   Sexual activity: Yes    Birth control/protection: Condom  Other Topics Concern   Not on file  Social History Narrative   Currently not working since 12/2019   Lives with long term girlfriend, Eric Townsend, who is a high Engineer, site.   Social Determinants of Health   Financial Resource Strain: Not on file  Food Insecurity: Not on file   Transportation Needs: Not on file  Physical Activity: Not on file  Stress: Not on file  Social Connections: Not on file    Allergies: No Known Allergies  Metabolic Disorder Labs: No results found for: "HGBA1C", "MPG" No results found for: "PROLACTIN" Lab Results  Component Value Date   CHOL 228 (H) 05/30/2020   TRIG 68 05/30/2020   HDL 109 05/30/2020   LDLCALC 107 (H) 05/30/2020   LDLCALC 70 10/05/2018   No results found for: "TSH"  Therapeutic Level Labs: No results found for: "LITHIUM" No results found for: "VALPROATE" No results found for: "CBMZ"  Current Medications: Current Outpatient Medications  Medication Sig Dispense Refill   buPROPion (WELLBUTRIN XL) 300 MG 24 hr tablet Take 1 tablet (300 mg total) by mouth every morning. 30 tablet 3   lamoTRIgine (LAMICTAL) 100 MG tablet Take 1 tablet (100 mg total) by mouth daily. 30 tablet 3   psyllium (METAMUCIL SMOOTH TEXTURE) 58.6 % powder 1/2 dose daily in 8 oz water. 283 g 12   terbinafine (LAMISIL) 250 MG tablet Take 1 tablet (250 mg total) by mouth daily. (Patient not taking: Reported on 07/06/2021) 56 tablet 0   traZODone (DESYREL) 100 MG tablet Take 1 tablet (100 mg total) by mouth at bedtime. 30 tablet 3   No current facility-administered medications for this visit.     Musculoskeletal: Strength & Muscle Tone: within normal limits, telehealth visit Gait & Station: normal, telehealth visit Patient leans: N/A  Psychiatric Specialty Exam: Review of Systems  There were no vitals taken for this visit.There is no height or weight on file to calculate BMI.  General Appearance: Well Groomed  Eye Contact:  Good  Speech:  Clear and Coherent and Pressured  Volume:  Normal  Mood:  Euthymic,   Affect:  Appropriate  Thought Process:  Coherent, Goal Directed and Linear  Orientation:  Full (Time, Place, and Person)  Thought Content: WDL and Logical   Suicidal Thoughts:  No  Homicidal Thoughts:  No  Memory:  Immediate;    Good Recent;   Good Remote;   Good  Judgement:  Good  Insight:  Good  Psychomotor Activity:  Normal  Concentration:  Concentration: Good and Attention Span: Good  Recall:  Good  Fund of Knowledge: Good  Language: Good  Akathisia:  No  Handed:  Right  AIMS (if indicated): Not done  Assets:  Communication Skills Desire for Improvement Housing Intimacy Leisure Time Physical Health Social Support  ADL's:  Intact  Cognition: WNL  Sleep:  Good   Screenings: GAD-7    Flowsheet Row Video Visit from 05/21/2021 in [facility] Clinical Support from 02/18/2021 in [facility] Clinical Support from 11/20/2020 in [facility] Clinical Support from 06/04/2020 in [facility] Clinical Support from 05/05/2020 in [facility]  Total GAD-7 Score 12 8 9 8 9       PHQ2-9    Flowsheet Row Video Visit from 05/21/2021 in [facility] Clinical Support from 02/18/2021 in [facility] Clinical Support from 11/20/2020 in [facility] Clinical Support from 06/04/2020 in [facility] Clinical Support from 05/05/2020 in [facility]  PHQ-2 Total Score 4 2 1 3 2   PHQ-9 Total Score 13 8 10 7 10       Flowsheet Row Video Visit from 05/21/2021 in [facility] Clinical Support from 02/18/2021 in [facility] Clinical Support from 11/20/2020 in [facility]  C-SSRS RISK CATEGORY Error: Q7 should not be populated when Q6 is No Error: Q7 should not be populated when Q6 is No Error: Q7 should not be populated when Q6 is No      Assessment and Plan: Patient notes that he is doing well on his current medication regimen. No medication changes made today. Patient  agreeable to continue medications as prescribed  1. Bipolar II disorder (HCC)  Continue- buPROPion (WELLBUTRIN XL) 300 MG 24 hr tablet; Take 1 tablet (300 mg total) by mouth every morning.  Dispense: 30 tablet; Refill: 3 Continue- lamoTRIgine (LAMICTAL) 100 MG tablet; Take 1 tablet (100 mg total) by mouth daily.  Dispense: 30 tablet; Refill: 3 Continue- traZODone (DESYREL) 100 MG tablet; Take 1 tablet (100 mg total) by mouth at bedtime.  Dispense: 30 tablet; Refill: 3  2. Attention deficit hyperactivity disorder (ADHD), predominantly inattentive type  Continue- buPROPion (WELLBUTRIN XL) 300 MG 24 hr tablet; Take 1 tablet (300 mg total) by mouth every morning.  Dispense: 30 tablet; Refill: 3     Follow-up in 3 months Follow-up with therapy  [facility], NP 01/27/2022, 12:53 PM

## 2022-02-05 ENCOUNTER — Other Ambulatory Visit (INDEPENDENT_AMBULATORY_CARE_PROVIDER_SITE_OTHER): Payer: 59

## 2022-02-05 DIAGNOSIS — Z13220 Encounter for screening for lipoid disorders: Secondary | ICD-10-CM | POA: Diagnosis not present

## 2022-02-05 DIAGNOSIS — Z79899 Other long term (current) drug therapy: Secondary | ICD-10-CM | POA: Diagnosis not present

## 2022-02-06 LAB — COMPREHENSIVE METABOLIC PANEL
ALT: 19 IU/L (ref 0–44)
AST: 23 IU/L (ref 0–40)
Albumin/Globulin Ratio: 1.8 (ref 1.2–2.2)
Albumin: 4.6 g/dL (ref 4.1–5.1)
Alkaline Phosphatase: 86 IU/L (ref 44–121)
BUN/Creatinine Ratio: 8 — ABNORMAL LOW (ref 9–20)
BUN: 9 mg/dL (ref 6–20)
Bilirubin Total: 0.5 mg/dL (ref 0.0–1.2)
CO2: 22 mmol/L (ref 20–29)
Calcium: 9.4 mg/dL (ref 8.7–10.2)
Chloride: 106 mmol/L (ref 96–106)
Creatinine, Ser: 1.09 mg/dL (ref 0.76–1.27)
Globulin, Total: 2.5 g/dL (ref 1.5–4.5)
Glucose: 94 mg/dL (ref 70–99)
Potassium: 4.6 mmol/L (ref 3.5–5.2)
Sodium: 144 mmol/L (ref 134–144)
Total Protein: 7.1 g/dL (ref 6.0–8.5)
eGFR: 92 mL/min/{1.73_m2} (ref >59)

## 2022-02-06 LAB — CBC WITH DIFFERENTIAL/PLATELET
Basophils Absolute: 0 10*3/uL (ref 0.0–0.2)
Basos: 1 %
EOS (ABSOLUTE): 0.1 10*3/uL (ref 0.0–0.4)
Eos: 2 %
Hematocrit: 46.4 % (ref 37.5–51.0)
Hemoglobin: 15.3 g/dL (ref 13.0–17.7)
Immature Grans (Abs): 0 10*3/uL (ref 0.0–0.1)
Immature Granulocytes: 0 %
Lymphocytes Absolute: 2.1 10*3/uL (ref 0.7–3.1)
Lymphs: 54 %
MCH: 28.8 pg (ref 26.6–33.0)
MCHC: 33 g/dL (ref 31.5–35.7)
MCV: 87 fL (ref 79–97)
Monocytes Absolute: 0.3 10*3/uL (ref 0.1–0.9)
Monocytes: 8 %
Neutrophils Absolute: 1.4 10*3/uL (ref 1.4–7.0)
Neutrophils: 35 %
Platelets: 234 10*3/uL (ref 150–450)
RBC: 5.31 x10E6/uL (ref 4.14–5.80)
RDW: 12.2 % (ref 11.6–15.4)
WBC: 3.8 10*3/uL (ref 3.4–10.8)

## 2022-02-06 LAB — LIPID PANEL W/O CHOL/HDL RATIO
Cholesterol, Total: 186 mg/dL (ref 100–199)
HDL: 101 mg/dL (ref >39)
LDL Chol Calc (NIH): 71 mg/dL (ref 0–99)
Triglycerides: 75 mg/dL (ref 0–149)
VLDL Cholesterol Cal: 14 mg/dL (ref 5–40)

## 2022-02-11 ENCOUNTER — Ambulatory Visit (INDEPENDENT_AMBULATORY_CARE_PROVIDER_SITE_OTHER): Payer: 59 | Admitting: Internal Medicine

## 2022-02-11 ENCOUNTER — Encounter: Payer: Self-pay | Admitting: Internal Medicine

## 2022-02-11 VITALS — BP 132/80 | HR 64 | Resp 12 | Ht 74.0 in | Wt 169.0 lb

## 2022-02-11 DIAGNOSIS — Z0000 Encounter for general adult medical examination without abnormal findings: Secondary | ICD-10-CM | POA: Diagnosis not present

## 2022-02-11 DIAGNOSIS — L089 Local infection of the skin and subcutaneous tissue, unspecified: Secondary | ICD-10-CM

## 2022-02-11 NOTE — Progress Notes (Signed)
Subjective:    Patient ID: Eric Townsend, male   DOB: 01/21/1991, 31 y.o.   MRN: [id number]   HPI  Here for Male CPE:  1.  STE:  Does not perform.  No family history of testicular cancer.  2.  PSA:  never.  No family history of prostate cancer.  3.  Guaiac Cards/FIT:  Never.  4.  Colonoscopy: Never.  No family history of colon cancer.   5.  Cholesterol/Glucose:  Glucose and cholesterol panel excellent Lipid Panel     Component Value Date/Time   CHOL 186 02/05/2022 1115   TRIG 75 02/05/2022 1115   HDL 101 02/05/2022 1115   LDLCALC 71 02/05/2022 1115   LABVLDL 14 02/05/2022 1115     6.  Immunizations:  Has not had influenza or COVID vaccine (current) Immunization History  Administered Date(s) Administered   Influenza Inj Mdck Quad With Preservative 05/30/2020   PFIZER(Purple Top)SARS-COV-2 Vaccination 08/03/2019, 08/27/2019, 05/02/2020   Pfizer Covid-19 Vaccine Bivalent Booster 40yrs & up 03/05/2021   Tdap 12/05/2012     Current Meds  Medication Sig   buPROPion (WELLBUTRIN XL) 300 MG 24 hr tablet Take 1 tablet (300 mg total) by mouth every morning. (Patient taking differently: Take 300 mg by mouth every morning. Occasional)   lamoTRIgine (LAMICTAL) 100 MG tablet Take 1 tablet (100 mg total) by mouth daily.   psyllium (METAMUCIL SMOOTH TEXTURE) 58.6 % powder 1/2 dose daily in 8 oz water.   traZODone (DESYREL) 100 MG tablet Take 1 tablet (100 mg total) by mouth at bedtime.   No Known Allergies  Past Medical History:  Diagnosis Date   Bipolar disorder ([place])    Generalized anxiety disorder    Was seen at [facility] for this or [employer] student counseling services. Has been on mediction:  Zoloft 100 mg--doesn't like, Prozac 40 mg   Testicular torsion    Past Surgical History:  Procedure Laterality Date   SURGERY SCROTAL / TESTICULAR Bilateral 05/2014   Removed left varicocele and performed bilateral orchiopexy   Family History  Problem Relation Age of Onset    Obesity Mother    Other Mother        Prediabetes   Kidney disease Father    Diabetes Father    Obesity Father    Hyperlipidemia Father    Depression Father        and Anxiety   Other Father 52       COVID   Social History   Socioeconomic History   Marital status: Soil scientist    Spouse name: Angela Nevin   Number of children: 0   Years of education: college grad sociology   Highest education level: Not on file  Occupational History   Occupation: Actuary.  Tobacco Use   Smoking status: Former    Packs/day: 0.25    Years: 9.00    Total pack years: 2.25    Types: Cigarettes    Quit date: 07/16/2018    Years since quitting: 3.6   Smokeless tobacco: Never  Substance and Sexual Activity   Alcohol use: Yes    Comment: History of too much, but now just occasional.   Drug use: Yes    Types: Marijuana    Comment: Edibles only.   Sexual activity: Yes    Birth control/protection: Condom  Other Topics Concern   Not on file  Social History Narrative   Currently not working since 12/2019   Lives with long term girlfriend, Angela Nevin,  who is a high Engineer, site.   Social Determinants of Health   Financial Resource Strain: Not on file  Food Insecurity: Not on file  Transportation Needs: Not on file  Physical Activity: Not on file  Stress: Not on file  Social Connections: Not on file  Intimate Partner Violence: Not on file      Review of Systems    Objective:   BP 132/80 (BP Location: Left Arm, Patient Position: Sitting, Cuff Size: Normal)   Pulse 64   Resp 12   Ht 6\' 2"  (1.88 m)   Wt 169 lb (76.7 kg)   BMI 21.70 kg/m   Physical Exam Constitutional:      Appearance: He is normal weight.  HENT:     Head: Normocephalic and atraumatic.     Right Ear: Tympanic membrane, ear canal and external ear normal.     Left Ear: Tympanic membrane, ear canal and external ear normal.     Nose: Nose normal.     Mouth/Throat:     Mouth: Mucous membranes are moist.      Pharynx: Oropharynx is clear.  Eyes:     Extraocular Movements: Extraocular movements intact.     Conjunctiva/sclera: Conjunctivae normal.     Pupils: Pupils are equal, round, and reactive to light.     Comments: Discs sharp  Neck:     Thyroid: No thyroid mass or thyromegaly.  Cardiovascular:     Rate and Rhythm: Normal rate and regular rhythm.     Heart sounds: S1 normal and S2 normal. No murmur heard.    No friction rub. No S3 or S4 sounds.     Comments: Carotid, radial, femoral, DP and PT pulses normal and equal.    Pulmonary:     Effort: Pulmonary effort is normal.     Breath sounds: Normal breath sounds and air entry.  Abdominal:     General: Abdomen is flat. Bowel sounds are normal.     Palpations: Abdomen is soft. There is no hepatomegaly, splenomegaly or mass.     Tenderness: There is no abdominal tenderness.     Hernia: No hernia is present.  Genitourinary:    Penis: Normal and circumcised.      Testes:        Right: Mass or tenderness not present. Right testis is descended.        Left: Mass or tenderness not present. Left testis is descended.     Comments: Testicles tacked down in scrotum. Musculoskeletal:        General: Normal range of motion.     Cervical back: Normal range of motion and neck supple.     Right lower leg: No edema.     Left lower leg: No edema.  Lymphadenopathy:     Head:     Right side of head: No submental or submandibular adenopathy.     Left side of head: No submental or submandibular adenopathy.     Cervical: No cervical adenopathy.     Upper Body:     Right upper body: No supraclavicular or axillary adenopathy.     Left upper body: No supraclavicular or axillary adenopathy.     Lower Body: No right inguinal adenopathy. No left inguinal adenopathy.  Skin:    General: Skin is warm.     Capillary Refill: Capillary refill takes less than 2 seconds.     Findings: Rash (Still with mild perifollicular inflammation, right scalp mainly.   Minimal associated flaking and loss  of hair density.) present.  Neurological:     General: No focal deficit present.     Mental Status: He is alert and oriented to person, place, and time.     Cranial Nerves: Cranial nerves 2-12 are intact.     Sensory: Sensation is intact.     Motor: Motor function is intact.     Coordination: Coordination is intact.     Gait: Gait is intact.     Deep Tendon Reflexes: Reflexes are normal and symmetric.  Psychiatric:        Mood and Affect: Mood normal.        Speech: Speech normal.        Behavior: Behavior normal. Behavior is cooperative.      Assessment & Plan   CPE Encourage STE first day of each month. Screening labs fine. Encouraged influenza vaccine and new COVID vaccine at pharmacy of choice or may return here  2.  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  Scraping and cultures did not identify an organism until 04/2021, when no fungal organisms seen on scraping, but ultimately grew epicoccus sp.  Unable to find whether considered possible human infectious agent or just allergen in literature and if the former is possible, what treatment is recommended.  Treated repeatedly with oral and topical antifungals, last effort with Terbinafine 250 mg daily for 8 weeks with some improvement.  Was unable to connect with ID in February when followed up.  Patient at the time was pleased with results.  Have also not been able to get into Derm with his lack of insurance or not taking medicaid.  Will check with ID again and see if can get into Derm now has [employer].

## 2022-06-04 DIAGNOSIS — R69 Illness, unspecified: Secondary | ICD-10-CM | POA: Diagnosis not present

## 2022-06-14 DIAGNOSIS — U071 COVID-19: Secondary | ICD-10-CM | POA: Diagnosis not present

## 2022-06-14 DIAGNOSIS — J029 Acute pharyngitis, unspecified: Secondary | ICD-10-CM | POA: Diagnosis not present

## 2022-06-14 DIAGNOSIS — Z6821 Body mass index (BMI) 21.0-21.9, adult: Secondary | ICD-10-CM | POA: Diagnosis not present

## 2022-06-14 DIAGNOSIS — H66002 Acute suppurative otitis media without spontaneous rupture of ear drum, left ear: Secondary | ICD-10-CM | POA: Diagnosis not present

## 2022-11-09 ENCOUNTER — Other Ambulatory Visit (HOSPITAL_COMMUNITY): Payer: Self-pay | Admitting: Psychiatry

## 2022-11-09 DIAGNOSIS — F3181 Bipolar II disorder: Secondary | ICD-10-CM

## 2022-11-11 ENCOUNTER — Other Ambulatory Visit (HOSPITAL_COMMUNITY): Payer: Self-pay | Admitting: Psychiatry

## 2022-11-11 ENCOUNTER — Telehealth (HOSPITAL_COMMUNITY): Payer: Self-pay | Admitting: Psychiatry

## 2022-11-11 DIAGNOSIS — F3181 Bipolar II disorder: Secondary | ICD-10-CM

## 2022-11-11 MED ORDER — LAMOTRIGINE 25 MG PO TABS: 25.0000 mg | ORAL_TABLET | Freq: Every day | ORAL | 1 refills | Status: DC

## 2022-11-11 MED ORDER — TRAZODONE HCL 100 MG PO TABS
100.0000 mg | ORAL_TABLET | Freq: Every day | ORAL | 3 refills | Status: DC
Start: 2022-11-11 — End: 2023-03-14

## 2022-11-11 NOTE — Telephone Encounter (Signed)
Patient notes that he has been off of Lamictal and Wellbutrin for over a month. He notes that Wellbutrin activated his anxiety. Patient also reports that he has been having difficulty sleeping without trazodone. Today patient agreeable to restart Lamictal 25 mg for two weeks, he will then increase to 50 mg (two tablets) for two weeks, and then he will increase to 75 mg (three)tablets. At this time Wellbutrin not restarted. He will continue trazodone as prescribed. Medication sent to preferred pharmacy.

## 2022-11-19 ENCOUNTER — Telehealth (INDEPENDENT_AMBULATORY_CARE_PROVIDER_SITE_OTHER): Payer: 59 | Admitting: Physician Assistant

## 2022-11-19 DIAGNOSIS — F1290 Cannabis use, unspecified, uncomplicated: Secondary | ICD-10-CM | POA: Diagnosis not present

## 2022-11-19 DIAGNOSIS — F3181 Bipolar II disorder: Secondary | ICD-10-CM

## 2022-11-20 ENCOUNTER — Encounter (HOSPITAL_COMMUNITY): Payer: Self-pay | Admitting: Physician Assistant

## 2022-11-20 NOTE — Progress Notes (Signed)
BH MD/PA/NP OP Progress Note  Virtual Visit via Video Note  I connected with Eric Townsend on 11/20/22 at  3:30 PM EDT by a video enabled telemedicine application and verified that I am speaking with the correct person using two identifiers.  Location: Patient: Home Provider: Clinic   I discussed the limitations of evaluation and management by telemedicine and the availability of in person appointments. The patient expressed understanding and agreed to proceed.  Follow Up Instructions:   I discussed the assessment and treatment plan with the patient. The patient was provided an opportunity to ask questions and all were answered. The patient agreed with the plan and demonstrated an understanding of the instructions.   The patient was advised to call back or seek an in-person evaluation if the symptoms worsen or if the condition fails to improve as anticipated.  I provided 42 minutes of non-face-to-face time during this encounter.  Meta Hatchet, PA   11/20/2022 10:40 PM Eric Townsend  MRN:  [id number]  Chief Complaint:  Chief Complaint  Patient presents with   Follow-up   HPI:   Eric Townsend is a 32 year old male with a past psychiatric history significant for bipolar 2 disorder who presents to [facility] via virtual video visit for follow-up.  Patient was last seen by Shanna Cisco, NP on 01/27/2022.  Patient recently called Dr. Doyne Keel to discuss being placed back on medications.  Patient is currently being managed on the following psychiatric medications:  Trazodone 100 mg at bedtime Lamictal 25 mg daily with the plan to increase to 50 mg after 2 weeks, then increase to 75 mg after 2 more weeks.  Patient reports that he has been taking his medication regularly.  He reports that he will be increasing his Lamictal dosage to 50 mg a week from now.  He reports that he started his medications a week ago and feels that he is over  thinking things too much.  He also states that he is trying to be more aware of not trying to step on any toes.  He reports that he feels like he is walking on eggshells when trying not to over think.  Patient reports that he still feels energetic but denies going as high as he used to be when not on medications.  He denies stress or irritability at this time and is happy to be back on track with his medications.  In regards to depression, he reports that it is always present in some capacity.  He reports that he is depressed about different things in his life.  He reports that trazodone has been helpful with managing his depression.  He endorses anxiety especially when overthinking things.  Patient reports that he feels pretty solid at this time but is dissatisfied with his current living situation.  Patient has aspirations to move into a larger space and has done dealing with apartments.  A PHQ-9 screen was performed with the patient scoring a 10.  A GAD-7 screen was also performed with the patient scoring an 8.  Patient is alert and oriented x 4, calm, cooperative, and fully engaged in conversation during the encounter.  Patient endorses pretty good mood.  Patient denies suicidal or homicidal ideations.  He further denies auditory or visual hallucinations and does not appear to be responding to internal/external stimuli.  Patient endorses good sleep and receives on average 6-1/2 sleep per night.  Patient endorses good appetite and eats on average  3 meals per day.  Patient endorses alcohol consumption on occasion.  Patient denies tobacco use.  Patient endorses the use of marijuana in the form of THC 8 and edibles.  Visit Diagnosis:    ICD-10-CM   1. Bipolar II disorder (HCC)  F31.81     2. Marijuana use  F12.90       Past Psychiatric History:  Anxiety Depression Attention deficit hyperactivity disorder Bipolar 2 disorder  Past Medical History:  Past Medical History:  Diagnosis Date   Bipolar  disorder (HCC)    Generalized anxiety disorder    Was seen at [facility] for this or [employer] student counseling services. Has been on mediction:  Zoloft 100 mg--doesn't like, Prozac 40 mg   Testicular torsion     Past Surgical History:  Procedure Laterality Date   SURGERY SCROTAL / TESTICULAR Bilateral 05/2014   Removed left varicocele and performed bilateral orchiopexy    Family Psychiatric History:  Father - anxiety and depression Mother - anxiety  Family History:  Family History  Problem Relation Age of Onset   Obesity Mother    Other Mother        Prediabetes   Kidney disease Father    Diabetes Father    Obesity Father    Hyperlipidemia Father    Depression Father        and Anxiety   Other Father 51       COVID    Social History:  Social History   Socioeconomic History   Marital status: Single    Spouse name: Albin Felling   Number of children: 0   Years of education: college grad sociology   Highest education level: Not on file  Occupational History   Occupation: Academic librarian.  Tobacco Use   Smoking status: Former    Packs/day: 0.25    Years: 9.00    Additional pack years: 0.00    Total pack years: 2.25    Types: Cigarettes    Quit date: 07/16/2018    Years since quitting: 4.3   Smokeless tobacco: Never  Substance and Sexual Activity   Alcohol use: Yes    Comment: History of too much, but now just occasional.   Drug use: Yes    Types: Marijuana    Comment: Edibles only.   Sexual activity: Yes    Birth control/protection: Condom  Other Topics Concern   Not on file  Social History Narrative   Currently not working since 12/2019   Lives with long term girlfriend, Albin Felling, who is a high Engineer, site.   Social Determinants of Health   Financial Resource Strain: Not on file  Food Insecurity: Not on file  Transportation Needs: Not on file  Physical Activity: Not on file  Stress: Not on file  Social Connections: Not on file    Allergies: No Known  Allergies  Metabolic Disorder Labs: No results found for: "HGBA1C", "MPG" No results found for: "PROLACTIN" Lab Results  Component Value Date   CHOL 186 02/05/2022   TRIG 75 02/05/2022   HDL 101 02/05/2022   LDLCALC 71 02/05/2022   LDLCALC 107 (H) 05/30/2020   No results found for: "TSH"  Therapeutic Level Labs: No results found for: "LITHIUM" No results found for: "VALPROATE" No results found for: "CBMZ"  Current Medications: Current Outpatient Medications  Medication Sig Dispense Refill   lamoTRIgine (LAMICTAL) 25 MG tablet Take 1 tablet (25 mg total) by mouth daily. Take one tablet (25mg ) for two weeks, increase to two tablets  for two weeks (50mg ), and then increase to 3 tablets (75 mg) 90 tablet 1   psyllium (METAMUCIL SMOOTH TEXTURE) 58.6 % powder 1/2 dose daily in 8 oz water. 283 g 12   terbinafine (LAMISIL) 250 MG tablet Take 1 tablet (250 mg total) by mouth daily. (Patient not taking: Reported on 07/06/2021) 56 tablet 0   traZODone (DESYREL) 100 MG tablet Take 1 tablet (100 mg total) by mouth at bedtime. 30 tablet 3   No current facility-administered medications for this visit.     Musculoskeletal: Strength & Muscle Tone: within normal limits Gait & Station: normal Patient leans: N/A  Psychiatric Specialty Exam: Review of Systems  Psychiatric/Behavioral:  Negative for decreased concentration, dysphoric mood, hallucinations, self-injury, sleep disturbance and suicidal ideas. The patient is nervous/anxious. The patient is not hyperactive.     There were no vitals taken for this visit.There is no height or weight on file to calculate BMI.  General Appearance: Casual  Eye Contact:  Good  Speech:  Clear and Coherent and Normal Rate  Volume:  Normal  Mood:  Anxious and Depressed  Affect:  Appropriate  Thought Process:  Coherent, Goal Directed, and Descriptions of Associations: Intact  Orientation:  Full (Time, Place, and Person)  Thought Content: WDL   Suicidal  Thoughts:  No  Homicidal Thoughts:  No  Memory:  Immediate;   Good Recent;   Good Remote;   Good  Judgement:  Good  Insight:  Good  Psychomotor Activity:  Normal  Concentration:  Concentration: Good and Attention Span: Good  Recall:  Good  Fund of Knowledge: Good  Language: Good  Akathisia:  No  Handed:  Right  AIMS (if indicated): not done  Assets:  Communication Skills Desire for Improvement Housing Social Support Transportation Vocational/Educational  ADL's:  Intact  Cognition: WNL  Sleep:  Good   Screenings: GAD-7    Flowsheet Row Video Visit from 11/19/2022 in [facility] Video Visit from 05/21/2021 in [facility] Clinical Support from 02/18/2021 in [facility] Clinical Support from 11/20/2020 in [facility] Clinical Support from 06/04/2020 in [facility]  Total GAD-7 Score 8 12 8 9 8       PHQ2-9    Flowsheet Row Video Visit from 11/19/2022 in [facility] Video Visit from 05/21/2021 in [facility] Clinical Support from 02/18/2021 in [facility] Clinical Support from 11/20/2020 in [facility] Clinical Support from 06/04/2020 in [facility]  PHQ-2 Total Score 2 4 2 1 3   PHQ-9 Total Score 10 13 8 10 7       Flowsheet Row Video Visit from 11/19/2022 in [facility] Video Visit from 05/21/2021 in [facility] Clinical Support from 02/18/2021 in [facility]  C-SSRS RISK CATEGORY Low Risk Error: Q7 should not be populated when Q6 is No Error: Q7 should not be populated when Q6 is No        Assessment and Plan:   Eric Townsend is a 32 year old male with a past psychiatric history significant for bipolar 2  disorder who presents to [facility] via virtual video visit for follow-up.  Patient reports that he has been taking his medications regularly and will be increasing his dosage of Lamictal from 25 mg to 50 mg a week from now.  Since taking his medications, patient reports that he has been experiencing over thinking.  He also states that he has been more aware of not trying to step on any toes.  Patient reports that he does experience elevated mood, but is not as high as it was when off his medications.  Patient also endorses some depression but states that trazodone has been effective in managing his symptoms.  Patient will continue to take his medications as prescribed with the plan to eventually take Lamictal 75 mg daily for the management of his bipolar 2 disorder.  Provider will continue to assess patient's mood on subsequent encounters.  Collaboration of Care: Collaboration of Care: Medication Management AEB provider managing patient's psychiatric medications and Psychiatrist AEB patient being followed by mental health provider at this facility  Patient/Guardian was advised Release of Information must be obtained prior to any record release in order to collaborate their care with an outside provider. Patient/Guardian was advised if they have not already done so to contact the registration department to sign all necessary forms in order for [place] to release information regarding their care.   Consent: Patient/Guardian gives verbal consent for treatment and assignment of benefits for services provided during this visit. Patient/Guardian expressed understanding and agreed to proceed.    Meta Hatchet, PA 11/20/2022, 10:40 PM

## 2022-12-29 ENCOUNTER — Other Ambulatory Visit (HOSPITAL_COMMUNITY): Payer: Self-pay | Admitting: Psychiatry

## 2022-12-29 ENCOUNTER — Telehealth (HOSPITAL_COMMUNITY): Payer: Self-pay | Admitting: Physician Assistant

## 2022-12-29 DIAGNOSIS — F3181 Bipolar II disorder: Secondary | ICD-10-CM

## 2022-12-29 MED ORDER — LAMOTRIGINE 100 MG PO TABS: 100.0000 mg | ORAL_TABLET | Freq: Every day | ORAL | 3 refills | Status: DC

## 2022-12-29 NOTE — Telephone Encounter (Signed)
Medication sent to preferred pharmacy

## 2022-12-31 ENCOUNTER — Telehealth (HOSPITAL_COMMUNITY): Payer: Self-pay

## 2022-12-31 NOTE — Telephone Encounter (Signed)
Medication management - Collateral called to see if a new Lamotrigine prescription had been sent in for patient to their [facility]. Verified this had been sent, that pt should still have refills there of Trazodone and reminded of pt's appt set for 01/12/23.

## 2023-01-07 NOTE — Telephone Encounter (Signed)
Thank you :)

## 2023-01-12 ENCOUNTER — Encounter (HOSPITAL_COMMUNITY): Payer: 59 | Admitting: Physician Assistant

## 2023-01-12 ENCOUNTER — Encounter (HOSPITAL_COMMUNITY): Payer: Self-pay

## 2023-01-19 ENCOUNTER — Telehealth (INDEPENDENT_AMBULATORY_CARE_PROVIDER_SITE_OTHER): Payer: 59 | Admitting: Physician Assistant

## 2023-01-19 ENCOUNTER — Encounter (HOSPITAL_COMMUNITY): Payer: Self-pay | Admitting: Physician Assistant

## 2023-01-19 DIAGNOSIS — F3181 Bipolar II disorder: Secondary | ICD-10-CM | POA: Diagnosis not present

## 2023-01-19 DIAGNOSIS — F1290 Cannabis use, unspecified, uncomplicated: Secondary | ICD-10-CM

## 2023-01-19 NOTE — Progress Notes (Signed)
BH MD/PA/NP OP Progress Note  Virtual Visit via Video Note  I connected with Eric Townsend on 01/19/23 at 10:30 AM EDT by a video enabled telemedicine application and verified that I am speaking with the correct person using two identifiers.  Location: Patient: Home Provider: Clinic   I discussed the limitations of evaluation and management by telemedicine and the availability of in person appointments. The patient expressed understanding and agreed to proceed.  Follow Up Instructions:   I discussed the assessment and treatment plan with the patient. The patient was provided an opportunity to ask questions and all were answered. The patient agreed with the plan and demonstrated an understanding of the instructions.   The patient was advised to call back or seek an in-person evaluation if the symptoms worsen or if the condition fails to improve as anticipated.  I provided 37 minutes of non-face-to-face time during this encounter.  Meta Hatchet, PA   01/19/2023 5:48 PM Eric Townsend  MRN:  [id number]  Chief Complaint:  No chief complaint on file.  HPI:   Eric Townsend is a 32 year old male with a past psychiatric history significant for bipolar 2 disorder who presents to [facility] via virtual video visit for follow-up.  Patient is currently being managed on the following psychiatric medications:  Trazodone 100 mg at bedtime Lamictal 100 mg daily  Patient reports that he has been having some issues related to his car.  He reports that his car recently broke down and had to get towed.  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  Patient reports that he has been applying for [employer] positions and nonprofit work.  Patient expressed that he is tired of living paycheck to paycheck.  Patient reports that the use of his medications have been going well.  Patient reports that he is  not overthinking things or being overly critical towards himself.  Patient endorses depressive symptoms on occasion but reports that he is not stuck in his depressive episodes surrounding.  Patient reports that he has been able to combat his depressive symptoms through the use of coping strategies.  Patient reports that he has been better about fully processing situations around him.  Patient reports that his use of Lamictal has not been overly noticeable.  He reports being less stress since the removal of Wellbutrin from his medication regimen.  Patient informed provider that he has a history of using Latuda and states that the use of the medication was effective in managing his mood.  Patient is open to the idea of using Latuda if his mood worsens  Patient is alert and oriented x 4, calm, cooperative, and fully engaged in conversation during the encounter.  Patient endorses some depression attributed to life stressors.  Patient denies suicidal or homicidal ideations; however, he does express having some thoughts of harming himself on occasion.  Patient expressed that he would never act on his thoughts to harm himself.  Patient denied auditory or visual hallucinations and does not appear to be responding to internal/external stimuli.  Patient endorses fair sleep and receives on average 6-1/2 hours of sleep per night.  Patient endorses good appetite and eats on average 2-2-1/2 meals per day.  Patient endorses alcohol consumption.  Patient denies tobacco use.  Patient endorses illicit drug use in the form of edibles.  Visit Diagnosis:    ICD-10-CM   1. Bipolar  II disorder (HCC)  F31.81       Past Psychiatric History:  Anxiety Depression Attention deficit hyperactivity disorder Bipolar 2 disorder  Past Medical History:  Past Medical History:  Diagnosis Date   Bipolar disorder (HCC)    Generalized anxiety disorder    Was seen at [facility] for this or [employer] student counseling services. Has been on  mediction:  Zoloft 100 mg--doesn't like, Prozac 40 mg   Testicular torsion     Past Surgical History:  Procedure Laterality Date   SURGERY SCROTAL / TESTICULAR Bilateral 05/2014   Removed left varicocele and performed bilateral orchiopexy    Family Psychiatric History:  Father - anxiety and depression Mother - anxiety  Family History:  Family History  Problem Relation Age of Onset   Obesity Mother    Other Mother        Prediabetes   Kidney disease Father    Diabetes Father    Obesity Father    Hyperlipidemia Father    Depression Father        and Anxiety   Other Father 46       COVID    Social History:  Social History   Socioeconomic History   Marital status: Single    Spouse name: Eric Townsend   Number of children: 0   Years of education: college grad sociology   Highest education level: Not on file  Occupational History   Occupation: Academic librarian.  Tobacco Use   Smoking status: Former    Current packs/day: 0.00    Average packs/day: 0.3 packs/day for 9.0 years (2.3 ttl pk-yrs)    Types: Cigarettes    Start date: 07/16/2009    Quit date: 07/16/2018    Years since quitting: 4.5   Smokeless tobacco: Never  Substance and Sexual Activity   Alcohol use: Yes    Comment: History of too much, but now just occasional.   Drug use: Yes    Types: Marijuana    Comment: Edibles only.   Sexual activity: Yes    Birth control/protection: Condom  Other Topics Concern   Not on file  Social History Narrative   Currently not working since 12/2019   Lives with long term girlfriend, Eric Townsend, who is a high Engineer, site.   Social Determinants of Health   Financial Resource Strain: Not on file  Food Insecurity: Not on file  Transportation Needs: Not on file  Physical Activity: Not on file  Stress: Not on file  Social Connections: Unknown (10/06/2021)   [facility] from [facility]   Social Network    Social Network: Not on file    Allergies: No Known  Allergies  Metabolic Disorder Labs: No results found for: "HGBA1C", "MPG" No results found for: "PROLACTIN" Lab Results  Component Value Date   CHOL 186 02/05/2022   TRIG 75 02/05/2022   HDL 101 02/05/2022   LDLCALC 71 02/05/2022   LDLCALC 107 (H) 05/30/2020   No results found for: "TSH"  Therapeutic Level Labs: No results found for: "LITHIUM" No results found for: "VALPROATE" No results found for: "CBMZ"  Current Medications: Current Outpatient Medications  Medication Sig Dispense Refill   lamoTRIgine (LAMICTAL) 100 MG tablet Take 1 tablet (100 mg total) by mouth daily. Take one tablet daily 30 tablet 3   psyllium (METAMUCIL SMOOTH TEXTURE) 58.6 % powder 1/2 dose daily in 8 oz water. 283 g 12   terbinafine (LAMISIL) 250 MG tablet Take 1 tablet (250 mg total) by mouth daily. (Patient not  taking: Reported on 07/06/2021) 56 tablet 0   traZODone (DESYREL) 100 MG tablet Take 1 tablet (100 mg total) by mouth at bedtime. 30 tablet 3   No current facility-administered medications for this visit.     Musculoskeletal: Strength & Muscle Tone: within normal limits Gait & Station: normal Patient leans: N/A  Psychiatric Specialty Exam: Review of Systems  Psychiatric/Behavioral:  Positive for sleep disturbance. Negative for decreased concentration, dysphoric mood, hallucinations, self-injury and suicidal ideas. The patient is nervous/anxious. The patient is not hyperactive.     There were no vitals taken for this visit.There is no height or weight on file to calculate BMI.  General Appearance: Casual  Eye Contact:  Good  Speech:  Clear and Coherent and Normal Rate  Volume:  Normal  Mood:  Anxious and Depressed  Affect:  Appropriate  Thought Process:  Coherent, Goal Directed, and Descriptions of Associations: Intact  Orientation:  Full (Time, Place, and Person)  Thought Content: WDL   Suicidal Thoughts:  No  Homicidal Thoughts:  No  Memory:  Immediate;   Good Recent;    Good Remote;   Good  Judgement:  Good  Insight:  Good  Psychomotor Activity:  Normal  Concentration:  Concentration: Good and Attention Span: Good  Recall:  Good  Fund of Knowledge: Good  Language: Good  Akathisia:  No  Handed:  Right  AIMS (if indicated): not done  Assets:  Communication Skills Desire for Improvement Housing Social Support Transportation Vocational/Educational  ADL's:  Intact  Cognition: WNL  Sleep:  Fair   Screenings: GAD-7    Flowsheet Row Video Visit from 11/19/2022 in [facility] Video Visit from 05/21/2021 in [facility] Clinical Support from 02/18/2021 in [facility] Clinical Support from 11/20/2020 in [facility] Clinical Support from 06/04/2020 in [facility]  Total GAD-7 Score 8 12 8 9 8       PHQ2-9    Flowsheet Row Video Visit from 01/19/2023 in [facility] Video Visit from 11/19/2022 in [facility] Video Visit from 05/21/2021 in [facility] Clinical Support from 02/18/2021 in [facility] Clinical Support from 11/20/2020 in [facility]  PHQ-2 Total Score 3 2 4 2 1   PHQ-9 Total Score 11 10 13 8 10       Flowsheet Row Video Visit from 01/19/2023 in [facility] Video Visit from 11/19/2022 in [facility] Video Visit from 05/21/2021 in [facility]  C-SSRS RISK CATEGORY Low Risk Low Risk Error: Q7 should not be populated when Q6 is No        Assessment and Plan:   Eric Townsend is a 32 year old male with a past psychiatric history significant for bipolar 2 disorder who presents to [facility] via virtual video visit for follow-up.  Patient  presents today encounter endorsing some generalized stressors stating that his car recently broke down.  He also reports that he has been tired of working on the farm and is been applying for positions related to [employer] work or nonprofit work.  He does report that his use of his medications have been going well and denies being overcome with depressive episodes.  He reports that he has also ward off his depressive episodes by utilizing better coping strategies.  He states that his mood  has improved since removing Wellbutrin from his medication regimen.  He reports that he has a past history of being on Latuda and states that the medication was effective in managing his mood.  Patient states that he would possibly be interested in restarting what to do if his mood were to ever worsen.  Patient to continue taking his medications as prescribed..  Patient's medication to be e-prescribed to pharmacy of choice.  Collaboration of Care: Collaboration of Care: Medication Management AEB provider managing patient's psychiatric medications and Psychiatrist AEB patient being followed by mental health provider at this facility  Patient/Guardian was advised Release of Information must be obtained prior to any record release in order to collaborate their care with an outside provider. Patient/Guardian was advised if they have not already done so to contact the registration department to sign all necessary forms in order for [place] to release information regarding their care.   Consent: Patient/Guardian gives verbal consent for treatment and assignment of benefits for services provided during this visit. Patient/Guardian expressed understanding and agreed to proceed.   1. Bipolar II disorder (HCC) Patient to continue taking Lamictal 100 mg daily for the management of his bipolar II disorder Patient to continue taking trazodone 100 mg at bedtime for management of his bipolar 2 disorder  Patient to follow up in 2  months Provider spent a total of 37 minutes with the patient/reviewing patient's chart  Meta Hatchet, PA 01/19/2023, 5:48 PM

## 2023-03-14 ENCOUNTER — Other Ambulatory Visit (HOSPITAL_COMMUNITY): Payer: Self-pay | Admitting: Psychiatry

## 2023-03-14 DIAGNOSIS — F3181 Bipolar II disorder: Secondary | ICD-10-CM

## 2023-03-14 MED ORDER — TRAZODONE HCL 100 MG PO TABS
100.0000 mg | ORAL_TABLET | Freq: Every day | ORAL | 3 refills | Status: DC
Start: 2023-03-14 — End: 2023-07-08

## 2023-03-14 MED ORDER — LAMOTRIGINE 100 MG PO TABS
100.0000 mg | ORAL_TABLET | Freq: Every day | ORAL | 3 refills | Status: DC
Start: 2023-03-14 — End: 2023-07-08

## 2023-03-23 ENCOUNTER — Encounter (HOSPITAL_COMMUNITY): Payer: Self-pay | Admitting: Physician Assistant

## 2023-03-23 ENCOUNTER — Telehealth (INDEPENDENT_AMBULATORY_CARE_PROVIDER_SITE_OTHER): Payer: 59 | Admitting: Physician Assistant

## 2023-03-23 DIAGNOSIS — F3181 Bipolar II disorder: Secondary | ICD-10-CM | POA: Diagnosis not present

## 2023-03-23 DIAGNOSIS — F900 Attention-deficit hyperactivity disorder, predominantly inattentive type: Secondary | ICD-10-CM

## 2023-03-23 NOTE — Progress Notes (Unsigned)
BH MD/PA/NP OP Progress Note  Virtual Visit via Video Note  I connected with Eric Townsend on 03/23/23 at 10:00 AM EDT by a video enabled telemedicine application and verified that I am speaking with the correct person using two identifiers.  Location: Patient: Home Provider: Clinic   I discussed the limitations of evaluation and management by telemedicine and the availability of in person appointments. The patient expressed understanding and agreed to proceed.  Follow Up Instructions:   I discussed the assessment and treatment plan with the patient. The patient was provided an opportunity to ask questions and all were answered. The patient agreed with the plan and demonstrated an understanding of the instructions.   The patient was advised to call back or seek an in-person evaluation if the symptoms worsen or if the condition fails to improve as anticipated.  I provided 32 minutes of non-face-to-face time during this encounter.  Meta Hatchet, PA   03/23/2023 1:10 PM Eric Townsend  MRN:  [id number]  Chief Complaint:  Chief Complaint  Patient presents with   Follow-up   HPI:   Eric Townsend is a 32 year old male with a past psychiatric history significant for bipolar 2 disorder who presents to [facility], [place] via virtual video visit for follow-up.  Patient is currently being managed on the following psychiatric medications:  Trazodone 100 mg at bedtime Lamictal 100 mg daily  Patient presents to the encounter stating that he likes using Lamictal.  He reports that the medication has been helpful in managing his mood.  History of the use of Lamictal, patient states that his highs are not too high and his levels are not desperately low.  He reports that he still experiences some depressive moments on occasion but states that his symptoms are mostly manageable.  Patient states that he has a history of ADD and has been on Wellbutrin in  the past.  Patient reports that his past use of Wellbutrin impacted his mood in a negative way.  Patient reports that he has noticed not focusing for long periods of time.  He states that he has a history of taking Adderall and feels he may need to be on a medication to help with his focus sometime in the near future.  Patient believes that he will need to be on medication to manage his focus when holding a different position, employment wise.  Patient inquired about other options available for managing his focus.  Provider discussed with patient about using Qelbree for the management of his ADD.  Patient was interested in trying the medication following the conclusion of the encounter.  A PHQ-9 screen was performed with the patient scoring at 13.  A GAD-7 screen was also performed with the patient scoring a 10.  Patient is alert and oriented x 4, calm, cooperative, and fully engaged in conversation during the encounter.  Patient endorses pretty good mood.  Patient denies suicidal or homicidal ideations.  He further denies auditory or visual hallucinations and does not appear to be responding to internal soft external stimuli.  Patient endorses fair sleep and receives on average 6 hours of sleep per night.  Patient endorses good appetite and eats on average 3 meals per day.  Patient endorses alcohol consumption on occasion.  Patient denies tobacco use.  Patient endorses illicit drug use in the form of cannabis.  Visit Diagnosis:    ICD-10-CM   1. Bipolar II disorder (HCC)  F31.81  Past Psychiatric History:  Anxiety Depression Attention deficit hyperactivity disorder Bipolar 2 disorder  Past Medical History:  Past Medical History:  Diagnosis Date   Bipolar disorder (HCC)    Generalized anxiety disorder    Was seen at [facility] for this or [employer] student counseling services. Has been on mediction:  Zoloft 100 mg--doesn't like, Prozac 40 mg   Testicular torsion     Past Surgical History:   Procedure Laterality Date   SURGERY SCROTAL / TESTICULAR Bilateral 05/2014   Removed left varicocele and performed bilateral orchiopexy    Family Psychiatric History:  Father - anxiety and depression Mother - anxiety  Family History:  Family History  Problem Relation Age of Onset   Obesity Mother    Other Mother        Prediabetes   Kidney disease Father    Diabetes Father    Obesity Father    Hyperlipidemia Father    Depression Father        and Anxiety   Other Father 17       COVID    Social History:  Social History   Socioeconomic History   Marital status: Single    Spouse name: Albin Felling   Number of children: 0   Years of education: college grad sociology   Highest education level: Not on file  Occupational History   Occupation: Academic librarian.  Tobacco Use   Smoking status: Former    Current packs/day: 0.00    Average packs/day: 0.3 packs/day for 9.0 years (2.3 ttl pk-yrs)    Types: Cigarettes    Start date: 07/16/2009    Quit date: 07/16/2018    Years since quitting: 4.6   Smokeless tobacco: Never  Substance and Sexual Activity   Alcohol use: Yes    Comment: History of too much, but now just occasional.   Drug use: Yes    Types: Marijuana    Comment: Edibles only.   Sexual activity: Yes    Birth control/protection: Condom  Other Topics Concern   Not on file  Social History Narrative   Currently not working since 12/2019   Lives with long term girlfriend, Albin Felling, who is a high Engineer, site.   Social Determinants of Health   Financial Resource Strain: Not on file  Food Insecurity: Not on file  Transportation Needs: Not on file  Physical Activity: Not on file  Stress: Not on file  Social Connections: Unknown (10/06/2021)   Received from [facility], [facility]   Social Network    Social Network: Not on file    Allergies: No Known Allergies  Metabolic Disorder Labs: No results found for: "HGBA1C", "MPG" No results found for:  "PROLACTIN" Lab Results  Component Value Date   CHOL 186 02/05/2022   TRIG 75 02/05/2022   HDL 101 02/05/2022   LDLCALC 71 02/05/2022   LDLCALC 107 (H) 05/30/2020   No results found for: "TSH"  Therapeutic Level Labs: No results found for: "LITHIUM" No results found for: "VALPROATE" No results found for: "CBMZ"  Current Medications: Current Outpatient Medications  Medication Sig Dispense Refill   lamoTRIgine (LAMICTAL) 100 MG tablet Take 1 tablet (100 mg total) by mouth daily. Take one tablet daily 30 tablet 3   psyllium (METAMUCIL SMOOTH TEXTURE) 58.6 % powder 1/2 dose daily in 8 oz water. 283 g 12   terbinafine (LAMISIL) 250 MG tablet Take 1 tablet (250 mg total) by mouth daily. (Patient not taking: Reported on 07/06/2021) 56 tablet 0  traZODone (DESYREL) 100 MG tablet Take 1 tablet (100 mg total) by mouth at bedtime. 30 tablet 3   No current facility-administered medications for this visit.     Musculoskeletal: Strength & Muscle Tone: within normal limits Gait & Station: normal Patient leans: N/A  Psychiatric Specialty Exam: Review of Systems  Psychiatric/Behavioral:  Positive for sleep disturbance. Negative for decreased concentration, dysphoric mood, hallucinations, self-injury and suicidal ideas. The patient is nervous/anxious. The patient is not hyperactive.     There were no vitals taken for this visit.There is no height or weight on file to calculate BMI.  General Appearance: Casual  Eye Contact:  Good  Speech:  Clear and Coherent and Normal Rate  Volume:  Normal  Mood:  Anxious and Depressed  Affect:  Appropriate  Thought Process:  Coherent, Goal Directed, and Descriptions of Associations: Intact  Orientation:  Full (Time, Place, and Person)  Thought Content: WDL   Suicidal Thoughts:  No  Homicidal Thoughts:  No  Memory:  Immediate;   Good Recent;   Good Remote;   Good  Judgement:  Good  Insight:  Good  Psychomotor Activity:  Normal  Concentration:   Concentration: Good and Attention Span: Good  Recall:  Good  Fund of Knowledge: Good  Language: Good  Akathisia:  No  Handed:  Right  AIMS (if indicated): not done  Assets:  Communication Skills Desire for Improvement Housing Social Support Transportation Vocational/Educational  ADL's:  Intact  Cognition: WNL  Sleep:  Fair   Screenings: GAD-7    Flowsheet Row Video Visit from 03/23/2023 in [facility] Video Visit from 11/19/2022 in [facility] Video Visit from 05/21/2021 in [facility] Clinical Support from 02/18/2021 in [facility] Clinical Support from 11/20/2020 in [facility]  Total GAD-7 Score 10 8 12 8 9       PHQ2-9    Flowsheet Row Video Visit from 03/23/2023 in [facility] Video Visit from 01/19/2023 in [facility] Video Visit from 11/19/2022 in [facility] Video Visit from 05/21/2021 in [facility] Clinical Support from 02/18/2021 in [facility]  PHQ-2 Total Score 3 3 2 4 2   PHQ-9 Total Score 13 11 10 13 8       Flowsheet Row Video Visit from 03/23/2023 in [facility] Video Visit from 01/19/2023 in [facility] Video Visit from 11/19/2022 in [facility]  C-SSRS RISK CATEGORY Moderate Risk Low Risk Low Risk        Assessment and Plan:   Eric Townsend is a 32 year old male with a past psychiatric history significant for bipolar 2 disorder who presents to [facility] via virtual video visit for follow-up.  Patient presents today encounter stating that his Lamictal has been helpful in managing his mood.  Although he experiences some depressive symptoms, he reports  that his symptoms are mostly manageable at this time.  Patient has noticed issues with his focus and concentration.  He reports that he has noticed not focusing for long periods of time and believes that he may need to be placed on the medication once he holds a different employment position.  Patient inquired about other ADHD medication options for managing his ADD symptoms.  Provider discussed with patient about using Qelbree for the management of his focus/concentration.  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  Patient was agreeable to recommendation.  Provider to contact patient to inform him when his samples are available for pickup.  Collaboration of Care: Collaboration of Care: Medication Management AEB provider managing patient's psychiatric medications and Psychiatrist AEB patient being followed by mental health provider at this facility  Patient/Guardian was advised Release of Information must be obtained prior to any record release in order to collaborate their care with an outside provider. Patient/Guardian was advised if they have not already done so to contact the registration department to sign all necessary forms in order for [place] to release information regarding their care.   Consent: Patient/Guardian gives verbal consent for treatment and assignment of benefits for services provided during this visit. Patient/Guardian expressed understanding and agreed to proceed.   1. Bipolar II disorder (HCC) Patient to continue taking Lamictal 100 mg daily for the management of his bipolar II disorder Patient to continue taking trazodone 100 mg at bedtime for management of his bipolar 2 disorder  2. Attention deficit hyperactivity disorder (ADHD), predominantly inattentive type  - viloxazine ER (QELBREE) 200 MG 24 hr capsule; Take 1 capsule (200 mg total) by  mouth daily.  Dispense: 21 capsule; Refill: 0  Patient to follow up in 2 months Provider spent a total of 32 minutes with the patient/reviewing patient's chart  Meta Hatchet, PA 03/23/2023, 1:10 PM

## 2023-03-24 MED ORDER — VILOXAZINE HCL ER 200 MG PO CP24
200.0000 mg | ORAL_CAPSULE | Freq: Every day | ORAL | 0 refills | Status: DC
Start: 2023-03-24 — End: 2023-07-22

## 2023-03-28 ENCOUNTER — Ambulatory Visit: Payer: 59 | Admitting: Internal Medicine

## 2023-03-28 VITALS — BP 120/80 | HR 68 | Resp 16 | Ht 74.0 in | Wt 172.0 lb

## 2023-03-28 DIAGNOSIS — R197 Diarrhea, unspecified: Secondary | ICD-10-CM

## 2023-03-28 DIAGNOSIS — R198 Other specified symptoms and signs involving the digestive system and abdomen: Secondary | ICD-10-CM

## 2023-03-28 NOTE — Progress Notes (Unsigned)
    Subjective:     Patient ID: Eric Townsend, male    DOB: 03/06/1991, 32 y.o.   MRN: [id number]  Chief Complaint  Patient presents with   parasite concern    HPI  Abdominal complaints/abnormal stool:  States that this past Thursday he noticed a worm moving in the center of his semi solid stool - brought in with him today in plastic container.  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  Had dark (almost black), watery stools on Thursday and Friday - more brown and solid over the last few days.  Reports nausea and epigastric discomfort on Thursday only, took Pepto-bismol.  Has not had a fever or noticed any blood in stool.  Does recall having some anal itching on Wednesday, denies at this time.  Has not been camping outdoors or drinking from a stream.    No Known Allergies   Current Meds  Medication Sig   lamoTRIgine (LAMICTAL) 100 MG tablet Take 1 tablet (100 mg total) by mouth daily. Take one tablet daily   traZODone (DESYREL) 100 MG tablet Take 1 tablet (100 mg total) by mouth at bedtime.   viloxazine ER (QELBREE) 200 MG 24 hr capsule Take 1 capsule (200 mg total) by mouth daily.        Objective:    BP 120/80 (BP Location: Right Arm, Patient Position: Sitting, Cuff Size: Normal)   Pulse 68   Resp 16   Ht 6\' 2"  (1.88 m)   Wt 172 lb (78 kg)   BMI 22.08 kg/m  {Vitals History (Optional):23777}  Physical Exam Constitutional:      General: He is not in acute distress.    Appearance: Normal appearance.  Cardiovascular:     Pulses: Normal pulses.     Heart sounds: S1 normal and S2 normal. No murmur heard. Pulmonary:     Effort: No respiratory distress.     Breath sounds: Normal breath sounds.  Abdominal:     General: Abdomen is flat. Bowel sounds are normal.     Palpations: Abdomen is soft. There is no hepatomegaly or splenomegaly.     Tenderness: There is no abdominal tenderness.         Assessment & Plan:     Abdominal complaints/diarrhea:  likely vegetation although questionable intestinal worm:  Will return with stool specimen for stool ova and parasite testing. Will also check FIT test.      Macarthur Critchley, Haroldine Laws FNP Student

## 2023-07-07 ENCOUNTER — Telehealth (HOSPITAL_COMMUNITY): Payer: Self-pay | Admitting: *Deleted

## 2023-07-07 ENCOUNTER — Telehealth (HOSPITAL_COMMUNITY): Payer: Self-pay | Admitting: Physician Assistant

## 2023-07-07 NOTE — Telephone Encounter (Signed)
Patient called asking for refill of Trazodone. Sent to front desk to make an appointment. Last seen Oct 2024.

## 2023-07-08 ENCOUNTER — Other Ambulatory Visit (HOSPITAL_COMMUNITY): Payer: Self-pay | Admitting: Physician Assistant

## 2023-07-08 DIAGNOSIS — F3181 Bipolar II disorder: Secondary | ICD-10-CM

## 2023-07-08 MED ORDER — TRAZODONE HCL 100 MG PO TABS
100.0000 mg | ORAL_TABLET | Freq: Every day | ORAL | 1 refills | Status: DC
Start: 2023-07-08 — End: 2023-07-22

## 2023-07-08 MED ORDER — LAMOTRIGINE 100 MG PO TABS
100.0000 mg | ORAL_TABLET | Freq: Every day | ORAL | 1 refills | Status: DC
Start: 2023-07-08 — End: 2023-07-22

## 2023-07-08 NOTE — Telephone Encounter (Signed)
Message acknowledged and reviewed.

## 2023-07-08 NOTE — Progress Notes (Signed)
Patient contacted facility for medication refill requests.  Patient's medication to be prescribed to pharmacy of choice.  Patient has an appointment scheduled with this provider for 07/21/2023.

## 2023-07-21 ENCOUNTER — Telehealth (INDEPENDENT_AMBULATORY_CARE_PROVIDER_SITE_OTHER): Payer: 59 | Admitting: Physician Assistant

## 2023-07-21 DIAGNOSIS — F3181 Bipolar II disorder: Secondary | ICD-10-CM | POA: Diagnosis not present

## 2023-07-21 DIAGNOSIS — F900 Attention-deficit hyperactivity disorder, predominantly inattentive type: Secondary | ICD-10-CM | POA: Diagnosis not present

## 2023-07-22 ENCOUNTER — Encounter (HOSPITAL_COMMUNITY): Payer: Self-pay | Admitting: Physician Assistant

## 2023-07-22 MED ORDER — LAMOTRIGINE 100 MG PO TABS: 100.0000 mg | ORAL_TABLET | Freq: Every day | ORAL | 1 refills | Status: DC

## 2023-07-22 MED ORDER — VILOXAZINE HCL ER 200 MG PO CP24
200.0000 mg | ORAL_CAPSULE | Freq: Every day | ORAL | 1 refills | Status: DC
Start: 2023-07-22 — End: 2023-12-16

## 2023-07-22 MED ORDER — TRAZODONE HCL 100 MG PO TABS: 100.0000 mg | ORAL_TABLET | Freq: Every day | ORAL | 1 refills | Status: DC

## 2023-07-22 NOTE — Progress Notes (Signed)
 BH MD/PA/NP OP Progress Note  Virtual Visit via Video Note  I connected with Glennon Mac on 07/21/23 at 11:30 AM EST by a video enabled telemedicine application and verified that I am speaking with the correct person using two identifiers.  Location: Patient: Home Provider: Clinic   I discussed the limitations of evaluation and management by telemedicine and the availability of in person appointments. The patient expressed understanding and agreed to proceed.  Follow Up Instructions:  I discussed the assessment and treatment plan with the patient. The patient was provided an opportunity to ask questions and all were answered. The patient agreed with the plan and demonstrated an understanding of the instructions.   The patient was advised to call back or seek an in-person evaluation if the symptoms worsen or if the condition fails to improve as anticipated.  I provided 48 minutes of non-face-to-face time during this encounter.  Meta Hatchet, PA    07/21/2023 11:30 AM Glennon Mac  MRN:  [id number]  Chief Complaint:  Chief Complaint  Patient presents with   Follow-up   Medication Refill   HPI:   Deontaye Civello. 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  Patient is currently being managed on the following psychiatric medications:  Trazodone 100 mg at bedtime Lamictal 100 mg daily sleep  Patient reports that he has been doing better and states that he recently received a job offer.  Patient states that he is still undergoing interviews and is helpful that his history of a misdemeanor does not prevent him from securing this position.  In regards to his medications, patient reports that his medication use has been  going well.  He reports that his use of Lamictal prevents him from being impulsive or manic.  He states that his use of trazodone helps him with sleep.  During his last encounter, patient was provided samples for the medication Qelbree.  He reports that he enjoyed his experience with the medication stating that he noticed more energy and feeling more motivated while on the medication.  Patient is interested in being prescribed Qelbree for the management of his ADHD. Patient has a history of ADHD and has been on Adderall for 10 years in the past.  He also reports that he was placed on Ritalin when he was younger.  In addition to medication management, patient is interested in being set up with therapy.  Patient is interested in being set up with a male therapist.  He states that therapist would be helpful in navigating the current stressors in his life.  For example, patient reports that there was a recent death in the family that he is still coping with.  A PHQ-9 screen was performed with the patient scoring at 13.  A GAD-7 screen that the patient had the patient scored at 15.  Patient is alert and oriented x 4, calm, cooperative, and fully engaged in conversation during the encounter.  Patient describes his mood as being down and stressed out.  Patient exhibits depressed mood with appropriate affect.  Patient endorses some suicidal ideation but denies having a specific plan in place.  He reports that he will reach out to friends/family if his ideations worsen.  Patient denies homicidal ideations.  He further denies auditory or visual hallucinations and does not  appear to be responding to internal/external stimuli.  Patient endorses good sleep and receives on average 6-1/2 hours of sleep per night.  Patient endorses good appetite and eats on average 3 meals per day.  Patient endorses alcohol consumption weekly stating that he feels as though he should take a break from his alcohol consumption.  Patient denies  tobacco use.  Patient endorses illicit drug use in the form of cannabis.  Visit Diagnosis:    ICD-10-CM   1. Attention deficit hyperactivity disorder (ADHD), predominantly inattentive type  F90.0 viloxazine ER (QELBREE) 200 MG 24 hr capsule    2. Bipolar II disorder (HCC)  F31.81 traZODone (DESYREL) 100 MG tablet    lamoTRIgine (LAMICTAL) 100 MG tablet      Past Psychiatric History:  Anxiety Depression Attention deficit hyperactivity disorder Bipolar 2 disorder  Past Medical History:  Past Medical History:  Diagnosis Date   Bipolar disorder (HCC)    Generalized anxiety disorder    [facility] at [facility] for this or [employer] student counseling services. Has been on mediction:  Zoloft 100 mg--doesn't like, Prozac 40 mg   Testicular torsion     Past Surgical History:  Procedure Laterality Date   SURGERY SCROTAL / TESTICULAR Bilateral 05/2014   Removed left varicocele and performed bilateral orchiopexy    Family Psychiatric History:  Father - anxiety and depression Mother - anxiety  Family History:  Family History  Problem Relation Age of Onset   Obesity Mother    Other Mother        Prediabetes   Kidney disease Father    Diabetes Father    Obesity Father    Hyperlipidemia Father    Depression Father        and Anxiety   Other Father 42       COVID    Social History:  Social History   Socioeconomic History   Marital status: Single    Spouse name: Albin Felling   Number of children: 0   Years of education: college grad sociology   Highest education level: Not on file  Occupational History   Occupation: Academic librarian.  Tobacco Use   Smoking status: Former    Current packs/day: 0.00    Average packs/day: 0.3 packs/day for 9.0 years (2.3 ttl pk-yrs)    Types: Cigarettes    Start date: 07/16/2009    Quit date: 07/16/2018    Years since quitting: 5.0   Smokeless tobacco: Never  Substance and Sexual Activity   Alcohol use: Yes    Comment: History of too much, but  now just occasional.   Drug use: Yes    Types: Marijuana    Comment: Edibles only.   Sexual activity: Yes    Birth control/protection: Condom  Other Topics Concern   Not on file  Social History Narrative   Currently not working since 12/2019   Lives with long term girlfriend, Albin Felling, who is a high Engineer, site.   Social Drivers of Corporate investment banker Strain: Not on file  Food Insecurity: Not on file  Transportation Needs: Not on file  Physical Activity: Not on file  Stress: Not on file  Social Connections: Unknown (10/06/2021)   Received from [facility]   Social Network    Social Network: Not on file    Allergies: No Known Allergies  Metabolic Disorder Labs: No results found for: "HGBA1C", "MPG" No results found for: "PROLACTIN" Lab Results  Component Value Date   CHOL 186  02/05/2022   TRIG 75 02/05/2022   HDL 101 02/05/2022   LDLCALC 71 02/05/2022   LDLCALC 107 (H) 05/30/2020   No results found for: "TSH"  Therapeutic Level Labs: No results found for: "LITHIUM" No results found for: "VALPROATE" No results found for: "CBMZ"  Current Medications: Current Outpatient Medications  Medication Sig Dispense Refill   lamoTRIgine (LAMICTAL) 100 MG tablet Take 1 tablet (100 mg total) by mouth daily. Take one tablet daily 30 tablet 1   psyllium (METAMUCIL SMOOTH TEXTURE) 58.6 % powder 1/2 dose daily in 8 oz water. (Patient not taking: Reported on 03/28/2023) 283 g 12   terbinafine (LAMISIL) 250 MG tablet Take 1 tablet (250 mg total) by mouth daily. (Patient not taking: Reported on 07/06/2021) 56 tablet 0   traZODone (DESYREL) 100 MG tablet Take 1 tablet (100 mg total) by mouth at bedtime. 30 tablet 1   viloxazine ER (QELBREE) 200 MG 24 hr capsule Take 1 capsule (200 mg total) by mouth daily. 30 capsule 1   No current facility-administered medications for this visit.     Musculoskeletal: Strength & Muscle Tone: within normal limits Gait &  Station: normal Patient leans: N/A  Psychiatric Specialty Exam: Review of Systems  Psychiatric/Behavioral:  Positive for decreased concentration, dysphoric mood and sleep disturbance. Negative for hallucinations, self-injury and suicidal ideas. The patient is nervous/anxious. The patient is not hyperactive.     There were no vitals taken for this visit.There is no height or weight on file to calculate BMI.  General Appearance: Casual  Eye Contact:  Good  Speech:  Clear and Coherent and Normal Rate  Volume:  Normal  Mood:  Anxious and Depressed  Affect:  Appropriate  Thought Process:  Coherent, Goal Directed, and Descriptions of Associations: Intact  Orientation:  Full (Time, Place, and Person)  Thought Content: WDL   Suicidal Thoughts:  No  Homicidal Thoughts:  No  Memory:  Immediate;   Good Recent;   Good Remote;   Good  Judgement:  Good  Insight:  Good  Psychomotor Activity:  Normal  Concentration:  Concentration: Good and Attention Span: Good  Recall:  Good  Fund of Knowledge: Good  Language: Good  Akathisia:  No  Handed:  Right  AIMS (if indicated): not done  Assets:  Communication Skills Desire for Improvement Housing Social Support Vocational/Educational  ADL's:  Intact  Cognition: WNL  Sleep:  Fair   Screenings: GAD-7    Flowsheet Row Video Visit from 07/21/2023 in [facility] Video Visit from 03/23/2023 in [facility] Video Visit from 11/19/2022 in [facility] Video Visit from 05/21/2021 in [facility] Clinical Support from 02/18/2021 in [facility]  Total GAD-7 Score 15 10 8 12 8       PHQ2-9    Flowsheet Row Video Visit from 07/21/2023 in [facility] Video Visit from 03/23/2023 in [facility] Video Visit from 01/19/2023 in [facility]  Video Visit from 11/19/2022 in [facility] Video Visit from 05/21/2021 in [facility]  PHQ-2 Total Score 3 3 3 2 4   PHQ-9 Total Score 13 13 11 10 13       Flowsheet Row Video Visit from 07/21/2023 in [facility] Video Visit from 03/23/2023 in [facility] Video Visit from 01/19/2023 in [facility]  C-SSRS  RISK CATEGORY Moderate Risk Moderate Risk Low Risk        Assessment and Plan:   Bowden T. 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  Patient presents today encounter reporting no issues or concerns regarding his current medication regimen.  Patient would like to continue taking his Lamictal and trazodone as prescribed.  He reports that his use of Qelbree help with managing his symptoms of ADHD.  Patient is interested in being prescribed a prescription for Qelbree.  Patient to be prescribed Qelbree in addition to his other psychiatric medications following the conclusion of the encounter.  Patient is also interested in being set up with a therapist following the conclusion of the encounter.  Collaboration of Care: Collaboration of Care: Medication Management AEB provider managing patient's psychiatric medications and Psychiatrist AEB patient being followed by a mental health provider  Patient/Guardian was advised Release of Information must be obtained prior to any record release in order to collaborate their care with an outside provider. Patient/Guardian was advised if they have not already done so to contact the registration department to sign all necessary forms in order for [place] to release  information regarding their care.   Consent: Patient/Guardian gives verbal consent for treatment and assignment of benefits for services provided during this visit. Patient/Guardian expressed understanding and agreed to proceed.   1. Attention deficit hyperactivity disorder (ADHD), predominantly inattentive type  - viloxazine ER (QELBREE) 200 MG 24 hr capsule; Take 1 capsule (200 mg total) by mouth daily.  Dispense: 30 capsule; Refill: 1  2. Bipolar II disorder (HCC)  - traZODone (DESYREL) 100 MG tablet; Take 1 tablet (100 mg total) by mouth at bedtime.  Dispense: 30 tablet; Refill: 1 - lamoTRIgine (LAMICTAL) 100 MG tablet; Take 1 tablet (100 mg total) by mouth daily. Take one tablet daily  Dispense: 30 tablet; Refill: 1  Patient to follow up in 2 months Provider spent a total of 48 minutes with the patient/reviewing the patient's chart  Meta Hatchet, PA 07/21/2023, 11:30 AM

## 2023-08-20 ENCOUNTER — Encounter (HOSPITAL_COMMUNITY): Payer: Self-pay | Admitting: Emergency Medicine

## 2023-08-20 ENCOUNTER — Emergency Department (HOSPITAL_COMMUNITY)

## 2023-08-20 ENCOUNTER — Emergency Department (HOSPITAL_COMMUNITY)
Admission: EM | Admit: 2023-08-20 | Discharge: 2023-08-20 | Disposition: A | Discharge status: Home / Self Care | Attending: Emergency Medicine | Admitting: Emergency Medicine

## 2023-08-20 DIAGNOSIS — F10920 Alcohol use, unspecified with intoxication, uncomplicated: Secondary | ICD-10-CM | POA: Diagnosis not present

## 2023-08-20 DIAGNOSIS — M2141 Flat foot [pes planus] (acquired), right foot: Secondary | ICD-10-CM | POA: Diagnosis not present

## 2023-08-20 DIAGNOSIS — W010XXA Fall on same level from slipping, tripping and stumbling without subsequent striking against object, initial encounter: Secondary | ICD-10-CM | POA: Insufficient documentation

## 2023-08-20 DIAGNOSIS — R410 Disorientation, unspecified: Secondary | ICD-10-CM | POA: Diagnosis not present

## 2023-08-20 DIAGNOSIS — R58 Hemorrhage, not elsewhere classified: Secondary | ICD-10-CM | POA: Diagnosis not present

## 2023-08-20 DIAGNOSIS — Z23 Encounter for immunization: Secondary | ICD-10-CM | POA: Insufficient documentation

## 2023-08-20 DIAGNOSIS — S61411A Laceration without foreign body of right hand, initial encounter: Secondary | ICD-10-CM

## 2023-08-20 DIAGNOSIS — R1111 Vomiting without nausea: Secondary | ICD-10-CM | POA: Diagnosis not present

## 2023-08-20 DIAGNOSIS — Y907 Blood alcohol level of 200-239 mg/100 ml: Secondary | ICD-10-CM | POA: Diagnosis not present

## 2023-08-20 DIAGNOSIS — F10120 Alcohol abuse with intoxication, uncomplicated: Secondary | ICD-10-CM | POA: Diagnosis not present

## 2023-08-20 DIAGNOSIS — R064 Hyperventilation: Secondary | ICD-10-CM | POA: Diagnosis not present

## 2023-08-20 DIAGNOSIS — S91312A Laceration without foreign body, left foot, initial encounter: Secondary | ICD-10-CM

## 2023-08-20 DIAGNOSIS — F29 Unspecified psychosis not due to a substance or known physiological condition: Secondary | ICD-10-CM | POA: Diagnosis not present

## 2023-08-20 DIAGNOSIS — S61213A Laceration without foreign body of left middle finger without damage to nail, initial encounter: Secondary | ICD-10-CM

## 2023-08-20 DIAGNOSIS — S6992XA Unspecified injury of left wrist, hand and finger(s), initial encounter: Secondary | ICD-10-CM | POA: Diagnosis not present

## 2023-08-20 LAB — RAPID URINE DRUG SCREEN, HOSP PERFORMED
Amphetamines: NOT DETECTED
Barbiturates: NOT DETECTED
Benzodiazepines: NOT DETECTED
Cocaine: NOT DETECTED
Opiates: NOT DETECTED
Tetrahydrocannabinol: POSITIVE — AB

## 2023-08-20 LAB — COMPREHENSIVE METABOLIC PANEL WITH GFR
ALT: 28 U/L (ref 0–44)
AST: 37 U/L (ref 15–41)
Albumin: 4.4 g/dL (ref 3.5–5.0)
Alkaline Phosphatase: 74 U/L (ref 38–126)
Anion gap: 11 (ref 5–15)
BUN: 18 mg/dL (ref 6–20)
CO2: 23 mmol/L (ref 22–32)
Calcium: 9.3 mg/dL (ref 8.9–10.3)
Chloride: 109 mmol/L (ref 98–111)
Creatinine, Ser: 1.1 mg/dL (ref 0.61–1.24)
GFR, Estimated: >60 mL/min (ref >60)
Glucose, Bld: 93 mg/dL (ref 70–99)
Potassium: 3.7 mmol/L (ref 3.5–5.1)
Sodium: 143 mmol/L (ref 135–145)
Total Bilirubin: 0.6 mg/dL (ref 0.0–1.2)
Total Protein: 7.5 g/dL (ref 6.5–8.1)

## 2023-08-20 LAB — CBC WITH DIFFERENTIAL/PLATELET
Abs Immature Granulocytes: 0.02 10*3/uL (ref 0.00–0.07)
Basophils Absolute: 0 10*3/uL (ref 0.0–0.1)
Basophils Relative: 0 %
Eosinophils Absolute: 0 10*3/uL (ref 0.0–0.5)
Eosinophils Relative: 0 %
HCT: 50.4 % (ref 39.0–52.0)
Hemoglobin: 15.7 g/dL (ref 13.0–17.0)
Immature Granulocytes: 0 %
Lymphocytes Relative: 17 %
Lymphs Abs: 1.1 10*3/uL (ref 0.7–4.0)
MCH: 28.7 pg (ref 26.0–34.0)
MCHC: 31.2 g/dL (ref 30.0–36.0)
MCV: 92.1 fL (ref 80.0–100.0)
Monocytes Absolute: 0.1 10*3/uL (ref 0.1–1.0)
Monocytes Relative: 2 %
Neutro Abs: 5 10*3/uL (ref 1.7–7.7)
Neutrophils Relative %: 81 %
Platelets: 245 10*3/uL (ref 150–400)
RBC: 5.47 MIL/uL (ref 4.22–5.81)
RDW: 12.5 % (ref 11.5–15.5)
WBC: 6.2 10*3/uL (ref 4.0–10.5)
nRBC: 0 % (ref 0.0–0.2)

## 2023-08-20 LAB — ETHANOL: Alcohol, Ethyl (B): 203 mg/dL — ABNORMAL HIGH (ref <10)

## 2023-08-20 MED ORDER — TETANUS-DIPHTH-ACELL PERTUSSIS 5-2.5-18.5 LF-MCG/0.5 IM SUSY
0.5000 mL | PREFILLED_SYRINGE | Freq: Once | INTRAMUSCULAR | Status: AC
Start: 2023-08-20 — End: 2023-08-20
  Administered 2023-08-20: 0.5 mL via INTRAMUSCULAR
  Filled 2023-08-20: qty 0.5

## 2023-08-20 MED ORDER — LIDOCAINE-EPINEPHRINE (PF) 2 %-1:200000 IJ SOLN
10.0000 mL | Freq: Once | INTRAMUSCULAR | Status: AC
  Administered 2023-08-20: 10 mL via INTRADERMAL
  Filled 2023-08-20: qty 20

## 2023-08-20 MED ORDER — SODIUM CHLORIDE 0.9 % IV BOLUS
1000.0000 mL | Freq: Once | INTRAVENOUS | Status: AC
  Administered 2023-08-20: 1000 mL via INTRAVENOUS

## 2023-08-20 NOTE — ED Notes (Signed)
Labs recollected

## 2023-08-20 NOTE — ED Provider Notes (Signed)
 Accepted handoff at shift change from [place], PA-C. Please see prior provider note for more detail.   Briefly: Patient is 33 y.o.   DDX: concern for alcohol intoxication, lacerations, nausea and vomiting, gastritis  Plan: Lacerations repaired by [place], PA-C and awaiting MTF at this time as patient still acutely intoxicated.  Physical Exam  BP 108/63   Pulse 85   Temp 98.8 F (37.1 C)   Resp 14   Ht 6\' 3"  (1.905 m)   Wt 108.9 kg   SpO2 100%   BMI 30.00 kg/m   Physical Exam Vitals and nursing note reviewed.  Constitutional:      Appearance: He is not toxic-appearing.  HENT:     Head: Normocephalic and atraumatic.     Mouth/Throat:     Mouth: Mucous membranes are moist.     Pharynx: No oropharyngeal exudate or posterior oropharyngeal erythema.  Eyes:     General:        Right eye: No discharge.        Left eye: No discharge.     Conjunctiva/sclera: Conjunctivae normal.     Pupils: Pupils are equal, round, and reactive to light.  Cardiovascular:     Rate and Rhythm: Normal rate and regular rhythm.     Pulses: Normal pulses.  Pulmonary:     Effort: Pulmonary effort is normal. No respiratory distress.     Breath sounds: Normal breath sounds. No wheezing or rales.  Abdominal:     General: Bowel sounds are normal. There is no distension.     Palpations: Abdomen is soft.     Tenderness: There is no abdominal tenderness. There is no guarding.  Musculoskeletal:        General: No deformity.       Hands:     Cervical back: Neck supple.     Right lower leg: No edema.     Left lower leg: No edema.       Feet:     Comments: Full active range of motion of all digits.  Skin:    General: Skin is warm and dry.     Capillary Refill: Capillary refill takes less than 2 seconds.  Neurological:     General: No focal deficit present.     Mental Status: He is alert and oriented to person, place, and time. Mental status is at baseline.     Comments: Clinically intoxicated,  smells of alcohol.  Psychiatric:        Mood and Affect: Mood normal.     Procedures  Procedures  ED Course / MDM    Medical Decision Making Amount and/or Complexity of Data Reviewed Labs: ordered. Radiology: ordered.  Risk Prescription drug management.   Plan at time of sign out is to allow patient to metabolize as he is currently acutely intoxicated.  After about 5 hours of observation, patient has returned close to baseline. As he is currently here with his spouse who will drive him home, I feel that he is stable for discharge. Cautioned against heavy drinking. Discussed wound care at home and potential signs of infection. Will plan on follow up for suture removal. Patient otherwise stable at this time and discharged home.       Smitty Knudsen, PA-C 08/20/23 1232    Margarita Grizzle, MD 08/21/23 [phone]

## 2023-08-20 NOTE — Discharge Instructions (Signed)
 You were seen today for concerns of lacerations and alcohol intoxication. You were allowed to metabolize the alcohol and have stabilized with your lacerations repaired with stitches which should remain in place for the next 7-10 days. These sutures need to be removed so anticipate having them rechecked in about 1 week to determine if they are ready for removal. If any signs of infection develop such as redness, pus, or severe swelling, return to the ER or follow up with your primary care provider for assessment.

## 2023-08-20 NOTE — ED Triage Notes (Signed)
 Pt arrives via EMS from home for laceration to L hand from cutting it on a piece of glass. Bleeding controlled at this time. EMS report ETOH on board. Pt hyperverbal, tearful, and hyperventilating at time of triage. Hx bipolar.

## 2023-08-20 NOTE — ED Provider Notes (Signed)
 [place] EMERGENCY DEPARTMENT AT [facility] Provider Note   CSN: [id number] Arrival date & time: 08/20/23  2130     History  Chief Complaint  Patient presents with   Laceration    Eric Townsend is a 33 y.o. male patient with ETOH on board, larger than normal amount. Vomiting at home this evening after drinking, while trying to hurry to the bathroom to vomit the toilet knocked over the glass soap dispenser shattering the floor and causing multiple lacerations.  Patient appears intoxicated at time of evaluation, he is up-to-date from tetanus.  Companied by his girlfriend at bedside provides most of the history.  Denies heavy alcohol use, states they do not give any alcohol in their home.  Does recreationally use edible cannabis but no other illicit substances. Level 5 caveat due to patient's Intoxication HPI     Home Medications Prior to Admission medications   Medication Sig Start Date End Date Taking? Authorizing Provider  lamoTRIgine (LAMICTAL) 100 MG tablet Take 1 tablet (100 mg total) by mouth daily. Take one tablet daily 07/22/23   Nwoko, Stephens Shire E, PA  psyllium (METAMUCIL SMOOTH TEXTURE) 58.6 % powder 1/2 dose daily in 8 oz water. Patient not taking: Reported on 03/28/2023 07/06/21   Julieanne Manson, MD  terbinafine (LAMISIL) 250 MG tablet Take 1 tablet (250 mg total) by mouth daily. Patient not taking: Reported on 07/06/2021 04/28/21   Julieanne Manson, MD  traZODone (DESYREL) 100 MG tablet Take 1 tablet (100 mg total) by mouth at bedtime. 07/22/23   Nwoko, Tommas Olp, PA  viloxazine ER (QELBREE) 200 MG 24 hr capsule Take 1 capsule (200 mg total) by mouth daily. 07/22/23   Meta Hatchet, PA      Allergies    Patient has no known allergies.    Review of Systems   Review of Systems  Reason unable to perform ROS: Intoxication.    Physical Exam Updated Vital Signs BP 114/78   Pulse 89   Temp 97.9 F (36.6 C) (Oral)   Resp 17   Ht 6\' 3"  (1.905 m)    Wt 108.9 kg   SpO2 100%   BMI 30.00 kg/m  Physical Exam Vitals and nursing note reviewed.  Constitutional:      Appearance: He is not toxic-appearing.  HENT:     Head: Normocephalic and atraumatic.     Mouth/Throat:     Mouth: Mucous membranes are moist.     Pharynx: No oropharyngeal exudate or posterior oropharyngeal erythema.  Eyes:     General:        Right eye: No discharge.        Left eye: No discharge.     Conjunctiva/sclera: Conjunctivae normal.     Pupils: Pupils are equal, round, and reactive to light.  Cardiovascular:     Rate and Rhythm: Normal rate and regular rhythm.     Pulses: Normal pulses.  Pulmonary:     Effort: Pulmonary effort is normal. No respiratory distress.     Breath sounds: Normal breath sounds. No wheezing or rales.  Abdominal:     General: Bowel sounds are normal. There is no distension.     Palpations: Abdomen is soft.     Tenderness: There is no abdominal tenderness. There is no guarding.  Musculoskeletal:        General: No deformity.       Hands:     Cervical back: Neck supple.     Right lower leg: No  edema.     Left lower leg: No edema.       Feet:     Comments: Full active range of motion of all digits.  Skin:    General: Skin is warm and dry.     Capillary Refill: Capillary refill takes less than 2 seconds.  Neurological:     General: No focal deficit present.     Mental Status: He is alert and oriented to person, place, and time. Mental status is at baseline.     Comments: Clinically intoxicated, smells of alcohol.  Psychiatric:        Mood and Affect: Mood normal.     ED Results / Procedures / Treatments   Labs (all labs ordered are listed, but only abnormal results are displayed) Labs Reviewed  ETHANOL - Abnormal; Notable for the following components:      Result Value   Alcohol, Ethyl (B) 203 (*)    All other components within normal limits  COMPREHENSIVE METABOLIC PANEL WITH GFR  CBC WITH DIFFERENTIAL/PLATELET  CBC  WITH DIFFERENTIAL/PLATELET  RAPID URINE DRUG SCREEN, HOSP PERFORMED    EKG None  Radiology DG Foot 2 Views Left Result Date: 08/20/2023 CLINICAL DATA:  Laceration to bilateral feet after cutting them on a piece of glass. EXAM: LEFT FOOT - 2 VIEW COMPARISON:  None Available. FINDINGS: There is no evidence of fracture or dislocation. There is no evidence of arthropathy or other focal bone abnormality. Pes planus. Soft tissues are unremarkable. IMPRESSION: 1. No acute findings. 2. Pes planus. Electronically Signed   By: Signa Kell M.D.   On: 08/20/2023 05:49   DG Foot 2 Views Right Result Date: 08/20/2023 CLINICAL DATA:  Laceration from glass EXAM: RIGHT FOOT - 2 VIEW COMPARISON:  None Available. FINDINGS: No opaque foreign body or soft tissue gas is seen. No acute fracture or dislocation. Collapse of the medial arch with talonavicular spurring on the lateral view. IMPRESSION: No acute finding.  No opaque foreign body. Pes planus. Electronically Signed   By: Tiburcio Pea M.D.   On: 08/20/2023 05:48   DG Hand 2 View Left Result Date: 08/20/2023 CLINICAL DATA:  Laceration to left hand EXAM: LEFT HAND - 2 VIEW COMPARISON:  None Available. FINDINGS: There is no evidence of fracture or dislocation. There is no evidence of arthropathy or other focal bone abnormality. Soft tissues are unremarkable. IMPRESSION: Negative. Electronically Signed   By: Signa Kell M.D.   On: 08/20/2023 05:48    Procedures .Laceration Repair  Date/Time: 08/20/2023 7:44 AM  Performed by: Paris Lore, PA-C Authorized by: Paris Lore, PA-C   Consent:    Consent obtained:  Verbal   Consent given by:  Patient   Risks discussed:  Infection, poor wound healing, need for additional repair, poor cosmetic result and nerve damage   Alternatives discussed:  No treatment, observation and delayed treatment Universal protocol:    Patient identity confirmed:  Verbally with patient and arm  band Anesthesia:    Anesthesia method:  None Laceration details:    Location:  Hand   Hand location:  R palm   Length (cm):  1.5 Exploration:    Contaminated: no   Skin repair:    Repair method:  Tissue adhesive Approximation:    Approximation:  Close Repair type:    Repair type:  Simple Post-procedure details:    Procedure completion:  Tolerated well, no immediate complications .Laceration Repair  Date/Time: 08/20/2023 7:45 AM  Performed by: Paris Lore,  PA-C Authorized by: Paris Lore, PA-C   Consent:    Consent obtained:  Verbal   Risks discussed:  Nerve damage, need for additional repair, infection, poor cosmetic result and poor wound healing   Alternatives discussed:  No treatment Universal protocol:    Patient identity confirmed:  Verbally with patient Anesthesia:    Anesthesia method:  Local infiltration   Local anesthetic:  Lidocaine 2% WITH epi Laceration details:    Location:  Toe   Toe location:  R second toe   Length (cm):  1 Pre-procedure details:    Preparation:  Imaging obtained to evaluate for foreign bodies Exploration:    Imaging obtained: x-ray     Imaging outcome: foreign body not noted   Skin repair:    Repair method:  Sutures   Suture size:  5-0   Suture material:  Prolene   Suture technique:  Simple interrupted   Number of sutures:  2 Approximation:    Approximation:  Close Post-procedure details:    Procedure completion:  Tolerated well, no immediate complications .Laceration Repair  Date/Time: 08/20/2023 7:45 AM  Performed by: Paris Lore, PA-C Authorized by: Paris Lore, PA-C   Consent:    Consent obtained:  Verbal   Consent given by:  Patient   Risks discussed:  Infection, need for additional repair, nerve damage and poor wound healing   Alternatives discussed:  No treatment Universal protocol:    Patient identity confirmed:  Verbally with patient and arm band Anesthesia:    Anesthesia  method:  Local infiltration   Local anesthetic:  Lidocaine 2% WITH epi Laceration details:    Location:  Finger   Finger location:  L long finger   Length (cm):  1.5 (jagged avulsion) Pre-procedure details:    Preparation:  Imaging obtained to evaluate for foreign bodies Exploration:    Imaging obtained: x-ray     Imaging outcome: foreign body not noted     Contaminated: no   Treatment:    Area cleansed with:  Saline   Amount of cleaning:  Standard Skin repair:    Repair method:  Sutures   Suture size:  5-0   Suture material:  Prolene   Suture technique:  Simple interrupted   Number of sutures:  2 Approximation:    Approximation:  Close Repair type:    Repair type:  Simple Post-procedure details:    Procedure completion:  Tolerated well, no immediate complications .Foreign Body Removal  Date/Time: 08/20/2023 7:51 AM  Performed by: Paris Lore, PA-C Authorized by: Paris Lore, PA-C  Consent: Verbal consent obtained. Consent given by: patient Patient identity confirmed: verbally with patient Body area: skin General location: lower extremity Location details: left foot  Sedation: Patient sedated: no  Patient restrained: no Patient cooperative: yes 1 objects recovered. Objects recovered: Shard of glass from the plantar surface of the left heel Post-procedure assessment: foreign body removed Patient tolerance: patient tolerated the procedure well with no immediate complications Comments: No foreign body identified on x-ray, tiny shard of glass visible on physical exam successfully removed without anesthesia.      Medications Ordered in ED Medications  sodium chloride 0.9 % bolus 1,000 mL (has no administration in time range)  Tdap (BOOSTRIX) injection 0.5 mL (0.5 mLs Intramuscular Given 08/20/23 0508)  lidocaine-EPINEPHrine (XYLOCAINE W/EPI) 2 %-1:200000 (PF) injection 10 mL (10 mLs Intradermal Given 08/20/23 0715)    ED Course/ Medical Decision  Making/ A&P  Medical Decision Making 33 year old male with EtOH intoxication and lacerations.  Reassuring vitals on intake, clinically intoxicated.  Multiple lacerations as above.  See physical exam.  Neurovascularly intact.  Normal pupils, little concern for opiate intoxication at this time.  Amount and/or Complexity of Data Reviewed Labs: ordered.    Details: EtOH 203, CMP unremarkable, CBC pending at time of shift change.  UDS pending at time of shift change. Radiology: ordered.    Details: X-rays as above without retained foreign body.  Risk Prescription drug management.   For body removed and lacerations repaired as above.  Patient improving in his mental status though still mildly intoxicated.  Pending remainder of labs and fluids at time of shift change.Care of this patient signed out to oncoming ED provider Eual Fines, PA-C at time of shift change. All pertinent HPI, physical exam, and laboratory findings were discussed with them prior to my departure. Disposition of patient pending completion of workup, reevaluation, and clinical judgement of oncoming ED provider.   MTF.   Jathniel and his partner  voiced understanding of her medical evaluation and treatment plan. Each of their questions answered to their expressed satisfaction.   This chart was dictated using voice recognition software, Dragon. Despite the best efforts of this provider to proofread and correct errors, errors may still occur which can change documentation meaning.          Final Clinical Impression(s) / ED Diagnoses Final diagnoses:  None    Rx / DC Orders ED Discharge Orders     None         Sherrilee Gilles 08/20/23 0753    Dione Booze, MD 08/20/23 2259

## 2023-08-31 ENCOUNTER — Telehealth: Payer: Self-pay

## 2023-08-31 ENCOUNTER — Encounter: Payer: Self-pay | Admitting: Internal Medicine

## 2023-08-31 ENCOUNTER — Ambulatory Visit (INDEPENDENT_AMBULATORY_CARE_PROVIDER_SITE_OTHER): Admitting: Internal Medicine

## 2023-08-31 VITALS — BP 112/70 | HR 65 | Resp 16 | Wt 166.2 lb

## 2023-08-31 DIAGNOSIS — Z4802 Encounter for removal of sutures: Secondary | ICD-10-CM

## 2023-08-31 NOTE — Telephone Encounter (Signed)
 Patient would like to be on wait list for a sooner CPE appointment.   Patient is available after 3:30 PM   We will call patient if there is a cancellation.

## 2023-08-31 NOTE — Patient Instructions (Signed)
 Call for redness, discharge, increased pain, swelling or fever

## 2023-08-31 NOTE — Progress Notes (Signed)
 Suture Removal  Date/Time: 08/31/2023 6:35 PM  Performed by: Julieanne Manson, MD Authorized by: Julieanne Manson, MD  Location: middle finger palmar surface of left hand and right second toe plantar flexor surface. Wound Appearance: clean Sutures Removed: 4 Post-removal: dressing applied Sutures placed in this facility: [facility]. Patient tolerance: patient tolerated the procedure well with no immediate complications    Flap lac of middle finger and straight lac horizontally in bend of 2nd toe

## 2023-09-22 ENCOUNTER — Telehealth (HOSPITAL_COMMUNITY): Payer: 59 | Admitting: Physician Assistant

## 2023-09-27 ENCOUNTER — Encounter (HOSPITAL_COMMUNITY): Payer: Self-pay

## 2023-09-27 ENCOUNTER — Telehealth (HOSPITAL_COMMUNITY): Admitting: Physician Assistant

## 2023-09-28 ENCOUNTER — Ambulatory Visit (INDEPENDENT_AMBULATORY_CARE_PROVIDER_SITE_OTHER): Admitting: Internal Medicine

## 2023-09-28 VITALS — BP 110/80 | HR 91 | Resp 16 | Ht 74.0 in | Wt 168.0 lb

## 2023-09-28 DIAGNOSIS — S60552S Superficial foreign body of left hand, sequela: Secondary | ICD-10-CM | POA: Diagnosis not present

## 2023-09-28 NOTE — Progress Notes (Signed)
    Subjective:    Patient ID: Eric Townsend, male   DOB: Mar 14, 1991, 33 y.o.   MRN: [id number]   HPI  Feels he has glass in wounds of left hand as feels discomfort with pressure across scars.  Has developed what looks like callus and wound is "humped up"  over the areas.  See hand and foot wounds from broken glass soap dispenser 08/20/2023  Current Meds  Medication Sig   lamoTRIgine  (LAMICTAL ) 100 MG tablet Take 1 tablet (100 mg total) by mouth daily. Take one tablet daily   traZODone  (DESYREL ) 100 MG tablet Take 1 tablet (100 mg total) by mouth at bedtime.   No Known Allergies   Review of Systems    Objective:   BP 110/80 (BP Location: Left Arm, Patient Position: Sitting, Cuff Size: Normal)   Pulse 91   Resp 16   Ht 6\' 2"  (1.88 m)   Wt 168 lb (76.2 kg)   SpO2 96%   BMI 21.57 kg/m   Physical Exam  Foreign Body Removal  Date/Time: 09/28/2023 11:56 AM  Performed by: Ronalee Cocking, MD Authorized by: Ronalee Cocking, MD  Intake: Left hand and left middle finger. Anesthesia: local infiltration  Anesthesia: Local Anesthetic: lidocaine  1% without epinephrine Anesthetic total: 1 mL  Sedation: Patient sedated: no  Patient restrained: no Complexity: simple 0 objects recovered. Post-procedure assessment: residual foreign bodies remain Patient tolerance: patient tolerated the procedure well with no immediate complications Comments: Middle finger callus just under distal IP joint shaved and an underlying soft white core removed.  No underlying glass or other FB found.  Bandage applied Lateral aspect of hypothenar eminence with 1 cm thickened callused wound with pinhole opening opened with 11 blade and wound sounded with small hemostat.  Grinding over what felt like small pieces of glass or metal, but not able to get hold of the FB for removal.  Grinding noted at either end of wound, but not center of wound. Wound left open to heal secondarily and perhaps allow  what are felt to be glass shards and easier exit. To keep the wound clean and dry with daily or more bandage changes--given supplies        Assessment & Plan  Left hand wounds, likely with retained small shards of glass. Discussed the FB would likely need to work their way out of wound on own. If continues to have discomfort, can refer to hand surgeon. Call for signs of infection

## 2023-09-28 NOTE — Patient Instructions (Signed)
 Keep wounds covered Change dressings daily--wash and apply bacitracin ointment Call for redness, increased pain, swelling or discharge or fever

## 2023-11-08 ENCOUNTER — Telehealth (HOSPITAL_COMMUNITY): Payer: Self-pay | Admitting: Physician Assistant

## 2023-11-08 ENCOUNTER — Other Ambulatory Visit (HOSPITAL_COMMUNITY): Payer: Self-pay | Admitting: Physician Assistant

## 2023-11-08 DIAGNOSIS — F3181 Bipolar II disorder: Secondary | ICD-10-CM

## 2023-11-09 ENCOUNTER — Other Ambulatory Visit (HOSPITAL_COMMUNITY): Payer: Self-pay | Admitting: Physician Assistant

## 2023-11-09 DIAGNOSIS — F900 Attention-deficit hyperactivity disorder, predominantly inattentive type: Secondary | ICD-10-CM

## 2023-11-09 DIAGNOSIS — F3181 Bipolar II disorder: Secondary | ICD-10-CM

## 2023-11-09 MED ORDER — LAMOTRIGINE 100 MG PO TABS: 100.0000 mg | ORAL_TABLET | Freq: Every day | ORAL | 1 refills | Status: DC

## 2023-11-09 MED ORDER — LAMOTRIGINE 25 MG PO TABS: ORAL_TABLET | ORAL | 0 refills | Status: DC

## 2023-11-09 MED ORDER — TRAZODONE HCL 100 MG PO TABS: 100.0000 mg | ORAL_TABLET | Freq: Every day | ORAL | 1 refills | Status: DC

## 2023-11-09 NOTE — Progress Notes (Signed)
 Provider was contacted by Celestine Colder regarding patient's medication refill request. Patient has not been seen since February by this provider. Since patient has not seen provider in so long, it is my assumption that patient has been without his Lamictal  for some time. To avoid adverse reaction from his use of lamictal , provider to start patient at Lamictal  25 mg for 14 days, followed by 50 mg for 14 more days, then continue taking 100 mg daily. Patient's other medication (trazodone ) to also be refilled. Per MAR, patient is no longer taking Qelbree. Patient's medication to be e-prescribed to pharmacy of choice.

## 2023-11-09 NOTE — Telephone Encounter (Signed)
 Message acknowledged and reviewed. Patient's medication to be e-prescribed to pharmacy of choice. See note.

## 2023-12-15 ENCOUNTER — Telehealth (HOSPITAL_COMMUNITY): Admitting: Physician Assistant

## 2023-12-15 DIAGNOSIS — F3181 Bipolar II disorder: Secondary | ICD-10-CM | POA: Diagnosis not present

## 2023-12-15 DIAGNOSIS — F900 Attention-deficit hyperactivity disorder, predominantly inattentive type: Secondary | ICD-10-CM | POA: Diagnosis not present

## 2023-12-16 ENCOUNTER — Encounter (HOSPITAL_COMMUNITY): Payer: Self-pay | Admitting: Physician Assistant

## 2023-12-16 MED ORDER — LAMOTRIGINE 100 MG PO TABS: 100.0000 mg | ORAL_TABLET | Freq: Every day | ORAL | 1 refills | Status: DC

## 2023-12-16 MED ORDER — VILOXAZINE HCL ER 200 MG PO CP24: 200.0000 mg | ORAL_CAPSULE | Freq: Every day | ORAL | 1 refills | Status: DC

## 2023-12-16 MED ORDER — TRAZODONE HCL 100 MG PO TABS: 100.0000 mg | ORAL_TABLET | Freq: Every day | ORAL | 1 refills | Status: DC

## 2023-12-16 NOTE — Progress Notes (Signed)
 [facility] MD/PA/NP OP Progress Note  Virtual Visit via Video Note  I connected with [facility] on 12/15/23 at  3:30 PM EDT by a video enabled telemedicine application and verified that I am speaking with the correct person using two identifiers.  Location: Patient: Home Provider: Clinic   I discussed the limitations of evaluation and management by telemedicine and the availability of in person appointments. The patient expressed understanding and agreed to proceed.  Follow Up Instructions:  I discussed the assessment and treatment plan with the patient. The patient was provided an opportunity to ask questions and all were answered. The patient agreed with the plan and demonstrated an understanding of the instructions.   The patient was advised to call back or seek an in-person evaluation if the symptoms worsen or if the condition fails to improve as anticipated.  I provided 32 minutes of non-face-to-face time during this encounter.  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  Patient is currently being managed on the following psychiatric medications:  Trazodone  100 mg at bedtime Lamictal  100 mg daily sleep  Patient presents to the encounter stating that he recently started a new job and that things have been going well within his new position.  He reports that he continues to do well through the use of his medications.  In regards to his use of trazodone , he states that his sleep continues to improve which causes his mood to be better  by extension.  Patient expressed to provider that he is interested in going back on Qelbree for the management of his ADHD symptoms.  Patient endorses some depression attributed to past issues with in his relationship.  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  A PHQ-9 screen was performed with the patient scoring a 13.  A GAD-7 screen was also performed with the patient scoring a 10.  Patient is alert and oriented x 4, calm, cooperative, and fully engaged in conversation during the encounter.  Patient endorses good mood.  Patient exhibits euthymic mood with appropriate affect.  Patient denies suicidal or homicidal ideations.  He further denies auditory or visual hallucinations and does not appear to be responding to internal/external stimuli.  Patient endorses fair sleep and receives on average 6-1/2 hours of sleep per night.  Patient endorses good appetite and eats on average 3 meals per day.  Patient denies alcohol consumption or tobacco use.  Patient does endorse use of THC-A.  Visit Diagnosis:    ICD-10-CM   1. Attention deficit hyperactivity disorder (ADHD), predominantly inattentive type  F90.0 viloxazine ER (QELBREE) 200 MG 24 hr capsule    2. Bipolar II disorder (HCC)  F31.81 lamoTRIgine  (LAMICTAL ) 100 MG tablet    traZODone  (DESYREL ) 100 MG tablet      Past Psychiatric History:  Anxiety Depression Attention deficit hyperactivity disorder Bipolar 2 disorder  Past Medical History:  Past Medical History:  Diagnosis Date   Bipolar disorder (HCC)    Generalized anxiety disorder  [facility] at [facility] or [facility]. Has been on mediction:  Zoloft 100 mg--doesn't like, Prozac 40 mg   Testicular torsion     Past Surgical History:  Procedure Laterality Date   SURGERY SCROTAL / TESTICULAR Bilateral 05/2014   Removed left varicocele and performed bilateral orchiopexy     Family Psychiatric History:  Father - anxiety and depression Mother - anxiety  Family History:  Family History  Problem Relation Age of Onset   Obesity Mother    Other Mother        Prediabetes   Kidney disease Father    Diabetes Father    Obesity Father    Hyperlipidemia Father    Depression Father        and Anxiety   Other Father 4       COVID    Social History:  Social History   Socioeconomic History   Marital status: Single    Spouse name: Eric [facility]   Number of children: 0   Years of education: college grad sociology   Highest education level: Not on file  Occupational History   Occupation: Academic librarian.  Tobacco Use   Smoking status: Former    Current packs/day: 0.00    Average packs/day: 0.3 packs/day for 9.0 years (2.3 ttl pk-yrs)    Types: Cigarettes    Start date: 07/16/2009    Quit date: 07/16/2018    Years since quitting: 5.4   Smokeless tobacco: Never  Substance and Sexual Activity   Alcohol use: Yes    Comment: History of too much, but now just occasional.   Drug use: Yes    Types: Marijuana    Comment: Edibles only.   Sexual activity: Yes    Birth control/protection: Condom  Other Topics Concern   Not on file  Social History Narrative   Currently not working since 12/2019   Lives with long term girlfriend, Eric [facility], who is a high Engineer, site.   Social Drivers of Corporate investment banker Strain: Not on file  Food Insecurity: Not on file  Transportation Needs: Not on file  Physical Activity: Not on file  Stress: Not on file  Social Connections: Unknown (10/06/2021)   Received from [facility]   Social Network    Social Network: Not on file    Allergies: No Known Allergies  Metabolic Disorder Labs: No results found for: HGBA1C, MPG No results found for: PROLACTIN Lab Results  Component Value Date   CHOL 186 02/05/2022   TRIG 75 02/05/2022   HDL 101 02/05/2022   LDLCALC 71 02/05/2022   LDLCALC 107 (H) 05/30/2020    No results found for: TSH  Therapeutic Level Labs: No results found for: LITHIUM No results found for: VALPROATE No results found for: CBMZ  Current Medications: Current Outpatient Medications  Medication Sig Dispense Refill   lamoTRIgine  (LAMICTAL ) 100 MG tablet Take 1 tablet (100 mg total) by mouth daily. 30 tablet 1   lamoTRIgine  (LAMICTAL ) 25 MG tablet Take 1 tablet (25 mg total) by mouth daily for 14 days, THEN 2 tablets (50 mg total) daily for 14 days. Take one tablet daily. 42 tablet 0   traZODone  (DESYREL ) 100 MG tablet Take 1 tablet (100 mg total) by mouth at bedtime. 30 tablet 1   viloxazine ER (QELBREE) 200 MG 24 hr capsule Take 1 capsule (200 mg total) by mouth daily. 30 capsule 1   No current facility-administered medications for this visit.  Musculoskeletal: Strength & Muscle Tone: within normal limits Gait & Station: normal Patient leans: N/A  Psychiatric Specialty Exam: Review of Systems  Psychiatric/Behavioral:  Positive for decreased concentration, dysphoric mood and sleep disturbance. Negative for hallucinations, self-injury and suicidal ideas. The patient is nervous/anxious. The patient is not hyperactive.     There were no vitals taken for this visit.There is no height or weight on file to calculate BMI.  General Appearance: Casual  Eye Contact:  Good  Speech:  Clear and Coherent and Normal Rate  Volume:  Normal  Mood:  Anxious and Depressed  Affect:  Appropriate  Thought Process:  Coherent, Goal Directed, and Descriptions of Associations: Intact  Orientation:  Full (Time, Place, and Person)  Thought Content: WDL   Suicidal Thoughts:  No  Homicidal Thoughts:  No  Memory:  Immediate;   Good Recent;   Good Remote;   Good  Judgement:  Good  Insight:  Good  Psychomotor Activity:  Normal  Concentration:  Concentration: Good and Attention Span: Good  Recall:  Good  Fund of Knowledge: Good  Language: Good  Akathisia:  No  Handed:  Right   AIMS (if indicated): not done  Assets:  Communication Skills Desire for Improvement Housing Social Support Vocational/Educational  ADL's:  Intact  Cognition: WNL  Sleep:  Fair   Screenings: GAD-7    Flowsheet Row Video Visit from [facility]15/2023 in [facility] Video Visit from 07/21/2023 in [facility] Video Visit from 03/23/2023 in [facility] from 11/19/2022 in [facility] from 05/21/2021 in [facility]  Total GAD-7 Score 10 15 10 8 12    PHQ2-9    Flowsheet Row Video Visit from 12/15/2023 in [facility] Video Visit from 07[facility]21/2023 in [facility] Visit from 03/23/2023 in [facility] Visit from 01/19/2023 in [facility] Video Visit from 11/19/2022 in [facility]  PHQ-2 Total Score 3 3 3 3 2   PHQ-9 Total Score 13 13 13 11 10    Flowsheet Row Video Visit from 12/15/2023 in [facility] from 08/20/2023 in [facility] Video Visit from 07/21/2023 in [facility]  C-SSRS RISK CATEGORY Moderate Risk No Risk Moderate Risk     Assessment and Plan:   Eric [facility] is a 33 year old male with a past psychiatric history significant for bipolar 2 disorder and 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  Patient presents to the encounter stating that he continues to take his medications regularly and denies experiencing any adverse side effects.  He reports that his use of trazodone  continues to improve his sleep which, by  extension, improved his mood.  Patient endorses some depression attributed to past stressors in his life such as issues within his relationship.  He reports that his current job has helped to mitigate his depression due to having financial security and new opportunities.  A PHQ-9 screen was performed with the patient scoring a 13.  A GAD-7 screen was also performed with the patient scoring a 10.  Patient endorses stability on his current medication regimen and would like to continue taking his medications as prescribed.  Prior to the conclusion of the  encounter, patient expressed to provider that he wanted to continue taking Qelbree for the management of his ADHD symptoms.  Patient is taken Monette in the past and found it effective in managing his symptoms of ADHD.  Provider to provide a prescription for patient and prescription to be sent to pharmacy of choice.  A [place] Suicide Severity Rating Scale was performed with the patient being considered moderate risk.  Patient denies suicidal ideations and is able to contract for safety at this time.  Safety planning was discussed with the patient prior to the conclusion of the encounter.  - Patient was instructed to contact 911 in the event of a mental health crisis. - Patient was instructed to contact [facility] in the event of a mental health crisis. - Patient was instructed to present to [facility] in the event of a mental health crisis.  Collaboration of Care: Collaboration of Care: Medication Management AEB provider managing patient's psychiatric medications and Psychiatrist AEB patient being followed by a mental health provider  Patient/Guardian was advised Release of Information must be obtained prior to any record release in order to collaborate their care with an outside provider. Patient/Guardian was advised if they have not already done so to contact the registration department to sign all  necessary forms in order for us  to release information regarding their care.   Consent: Patient/Guardian gives verbal consent for treatment and assignment of benefits for services provided during this visit. Patient/Guardian expressed understanding and agreed to proceed.   1. Bipolar II disorder (HCC)  - lamoTRIgine  (LAMICTAL ) 100 MG tablet; Take 1 tablet (100 mg total) by mouth daily.  Dispense: 30 tablet; Refill: 1 - traZODone  (DESYREL ) 100 MG tablet; Take 1 tablet (100 mg total) by mouth at bedtime.  Dispense: 30 tablet; Refill: 1  2. Attention deficit hyperactivity disorder (ADHD), predominantly inattentive type (Primary)  - viloxazine ER (QELBREE) 200 MG 24 hr capsule; Take 1 capsule (200 mg total) by mouth daily.  Dispense: 30 capsule; Refill: 1  Patient to follow up in 6 weeks Provider spent a total of 32 minutes with the patient/reviewing the patient's chart  Eric FORBES Bolster, PA 12/15/2023, 3:30 PM

## 2024-01-27 ENCOUNTER — Telehealth (HOSPITAL_COMMUNITY): Admitting: Physician Assistant

## 2024-01-27 DIAGNOSIS — F900 Attention-deficit hyperactivity disorder, predominantly inattentive type: Secondary | ICD-10-CM

## 2024-01-27 DIAGNOSIS — F3181 Bipolar II disorder: Secondary | ICD-10-CM | POA: Diagnosis not present

## 2024-01-28 ENCOUNTER — Encounter (HOSPITAL_COMMUNITY): Payer: Self-pay | Admitting: Physician Assistant

## 2024-01-28 MED ORDER — LAMOTRIGINE 100 MG PO TABS
100.0000 mg | ORAL_TABLET | Freq: Every day | ORAL | 2 refills | Status: DC
Start: 2024-01-28 — End: 2024-03-20

## 2024-01-28 MED ORDER — TRAZODONE HCL 100 MG PO TABS
100.0000 mg | ORAL_TABLET | Freq: Every day | ORAL | 2 refills | Status: DC
Start: 2024-01-28 — End: 2024-03-20

## 2024-01-28 NOTE — Progress Notes (Signed)
 BH MD/PA/NP OP Progress Note  Virtual Visit via Video Note  I connected with Eric Townsend on 01/27/24 at 11:30 AM EDT by a video enabled telemedicine application and verified that I am speaking with the correct person using two identifiers.  Location: Patient: Home Provider: Clinic   I discussed the limitations of evaluation and management by telemedicine and the availability of in person appointments. The patient expressed understanding and agreed to proceed.  Follow Up Instructions:  I discussed the assessment and treatment plan with the patient. The patient was provided an opportunity to ask questions and all were answered. The patient agreed with the plan and demonstrated an understanding of the instructions.   The patient was advised to call back or seek an in-person evaluation if the symptoms worsen or if the condition fails to improve as anticipated.  I provided 30 minutes of non-face-to-face time during this encounter.  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  Patient is currently being managed on the following psychiatric medications:  Trazodone  100 mg at bedtime Lamictal  100 mg daily sleep  Patient reports that his use of trazodone  has been going well.  He also reports that he has been taking his Lamictal  regularly and denies experiencing any adverse side effects associated with the medication.  Provider discussed with patient that his prescription of Monette was sent to his pharmacy of choice.  Patient vocalized  understanding.  Patient reports that he experiences some depression attributed to personal issues but states that things in his life are going well and that work has been going extremely well.  A PHQ-9 screen was performed with the patient scoring to 12.  A GAD-7 screen was also performed with the patient scoring to 10.  Patient is alert and oriented x 4, calm, cooperative, and fully engaged in conversation during the encounter.  Patient describes his mood distress.  Patient exhibits depressed mood with appropriate affect.  Patient endorses passive suicidal ideation but denies having a plan or intent.  He denies homicidal ideations.  He further denies auditory or visual hallucinations and does not appear to be responding to internal/external stimuli.  Patient endorses fair sleep and receives on average 6-1/2 hours of sleep per night.  Patient endorses fair appetite and eats on average 2 meals per day.  Patient denies alcohol consumption or tobacco use.  Patient endorses illicit drug use in the form of marijuana.  Visit Diagnosis:    ICD-10-CM   1. Bipolar II disorder (HCC)  F31.81 traZODone  (DESYREL ) 100 MG tablet    lamoTRIgine  (LAMICTAL ) 100 MG tablet      Past Psychiatric History:  Anxiety Depression Attention deficit hyperactivity disorder Bipolar 2 disorder  Past Medical History:  Past Medical History:  Diagnosis Date   Bipolar disorder (HCC)    Generalized anxiety disorder    Was seen at [facility] for this or [employer] student counseling services. Has been on mediction:  Zoloft 100 mg--doesn't like, Prozac 40 mg   Testicular torsion     Past Surgical History:  Procedure Laterality Date   SURGERY  SCROTAL / TESTICULAR Bilateral 05/2014   Removed left varicocele and performed bilateral orchiopexy    Family Psychiatric History:  Father - anxiety and depression Mother - anxiety  Family History:  Family History  Problem Relation Age of Onset   Obesity Mother    Other Mother         Prediabetes   Kidney disease Father    Diabetes Father    Obesity Father    Hyperlipidemia Father    Depression Father        and Anxiety   Other Father 27       COVID    Social History:  Social History   Socioeconomic History   Marital status: Single    Spouse name: [name]   Number of children: 0   Years of education: college grad sociology   Highest education level: Not on file  Occupational History   Occupation: Academic librarian.  Tobacco Use   Smoking status: Former    Current packs/day: 0.00    Average packs/day: 0.3 packs/day for 9.0 years (2.3 ttl pk-yrs)    Types: Cigarettes    Start date: 07/16/2009    Quit date: 07/16/2018    Years since quitting: 5.5   Smokeless tobacco: Never  Substance and Sexual Activity   Alcohol use: Yes    Comment: History of too much, but now just occasional.   Drug use: Yes    Types: Marijuana    Comment: Edibles only.   Sexual activity: Yes    Birth control/protection: Condom  Other Topics Concern   Not on file  Social History Narrative   Currently not working since 12/2019   Lives with long term girlfriend, [name], who is a high Engineer, site.   Social Drivers of Corporate investment banker Strain: Not on file  Food Insecurity: Not on file  Transportation Needs: Not on file  Physical Activity: Not on file  Stress: Not on file  Social Connections: Unknown (10/06/2021)   Received from [facility]   Social Network    Social Network: Not on file    Allergies: No Known Allergies  Metabolic Disorder Labs: No results found for: HGBA1C, MPG No results found for: PROLACTIN Lab Results  Component Value Date   CHOL 186 02/05/2022   TRIG 75 02/05/2022   HDL 101 02/05/2022   LDLCALC 71 02/05/2022   LDLCALC 107 (H) 05/30/2020   No results found for: TSH  Therapeutic Level Labs: No results found for: LITHIUM No results found for: VALPROATE No results found for: CBMZ  Current Medications: Current  Outpatient Medications  Medication Sig Dispense Refill   lamoTRIgine  (LAMICTAL ) 100 MG tablet Take 1 tablet (100 mg total) by mouth daily. 30 tablet 2   lamoTRIgine  (LAMICTAL ) 25 MG tablet Take 1 tablet (25 mg total) by mouth daily for 14 days, THEN 2 tablets (50 mg total) daily for 14 days. Take one tablet daily. 42 tablet 0   traZODone  (DESYREL ) 100 MG tablet Take 1 tablet (100 mg total) by mouth at bedtime. 30 tablet 2   viloxazine ER (QELBREE) 200 MG 24 hr capsule Take 1 capsule (200 mg total) by mouth daily. 30 capsule 1   No current facility-administered medications for this visit.     Musculoskeletal: Strength & Muscle Tone: within normal limits Gait & Station: normal Patient leans: N/A  Psychiatric Specialty Exam: Review of Systems  Psychiatric/Behavioral:  Positive for decreased concentration, dysphoric mood and sleep disturbance. Negative for hallucinations, self-injury and suicidal  ideas. The patient is nervous/anxious. The patient is not hyperactive.     There were no vitals taken for this visit.There is no height or weight on file to calculate BMI.  General Appearance: Casual  Eye Contact:  Good  Speech:  Clear and Coherent and Normal Rate  Volume:  Normal  Mood:  Anxious and Depressed  Affect:  Appropriate  Thought Process:  Coherent, Goal Directed, and Descriptions of Associations: Intact  Orientation:  Full (Time, Place, and Person)  Thought Content: WDL   Suicidal Thoughts:  No  Homicidal Thoughts:  No  Memory:  Immediate;   Good Recent;   Good Remote;   Good  Judgement:  Good  Insight:  Good  Psychomotor Activity:  Normal  Concentration:  Concentration: Good and Attention Span: Good  Recall:  Good  Fund of Knowledge: Good  Language: Good  Akathisia:  No  Handed:  Right  AIMS (if indicated): not done  Assets:  Communication Skills Desire for Improvement Housing Social Support Vocational/Educational  ADL's:  Intact  Cognition: WNL  Sleep:  Fair    Screenings: GAD-7    Flowsheet Row Video Visit from 01/27/2024 in [facility] Video Visit from 12/15/2023 in [facility] Video Visit from 07/21/2023 in [facility] Video Visit from 03/23/2023 in [facility] Video Visit from 11/19/2022 in [facility]  Total GAD-7 Score 10 10 15 10 8    PHQ2-9    Flowsheet Row Video Visit from 01/27/2024 in [facility] Video Visit from 12/15/2023 in [facility] Video Visit from 07/21/2023 in [facility] Video Visit from 03/23/2023 in [facility] Video Visit from 01/19/2023 in [facility]  PHQ-2 Total Score 3 3 3 3 3   PHQ-9 Total Score 12 13 13 13 11    Flowsheet Row Video Visit from 01/27/2024 in [facility] Video Visit from 12/15/2023 in [facility] ED from 08/20/2023 in [facility] at [facility]  C-SSRS RISK CATEGORY Moderate Risk Moderate Risk No Risk     Assessment and Plan:   Eeshan Verbrugge. 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  Patient presents to the encounter stating that he has been taking his medications regularly and denies experiencing any adverse side effects.  Patient was informed that he has a prescription of Qelbree at his pharmacy of choice.  Provider encouraged patient to let facility know if he is unable to afford the medication.  Patient vocalized understanding.  Patient reports that the use of his medications have been going  well.  He still continues to endorse some depression attributed to stressors in his life but states that work has been going well.  A PHQ-9 screen was performed with the patient scoring a 12.  A GAD-7 screen was also performed with the patient scoring a 10.  Patient endorses stability on his current medication regimen.  Patient would like to continue taking his medications as prescribed.  Patient's medications to be e-prescribed to pharmacy of choice.  A [place] Suicide Severity Rating Scale was performed with the patient being considered moderate risk.  Patient endorses passive suicidal ideations with  no intent or plan.  Safety planning was discussed with the patient prior to the conclusion of the encounter.  - Patient was instructed to contact 911 in the event of a mental health crisis. - Patient was instructed to contact 988 Suicide and Crisis Lifeline in the event of a mental health crisis. - Patient was instructed to present to [facility] in the event of a mental health crisis.  Collaboration of Care: Collaboration of Care: Medication Management AEB provider managing patient's psychiatric medications and Psychiatrist AEB patient being followed by a mental health provider  Patient/Guardian was advised Release of Information must be obtained prior to any record release in order to collaborate their care with an outside provider. Patient/Guardian was advised if they have not already done so to contact the registration department to sign all necessary forms in order for us  to release information regarding their care.   Consent: Patient/Guardian gives verbal consent for treatment and assignment of benefits for services provided during this visit. Patient/Guardian expressed understanding and agreed to proceed.   1. Bipolar II disorder (HCC)  - traZODone  (DESYREL ) 100 MG tablet; Take 1 tablet (100 mg total) by mouth at bedtime.  Dispense: 30 tablet; Refill: 2 -  lamoTRIgine  (LAMICTAL ) 100 MG tablet; Take 1 tablet (100 mg total) by mouth daily.  Dispense: 30 tablet; Refill: 2  2. 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

## 2024-02-16 ENCOUNTER — Telehealth: Payer: Self-pay | Admitting: Internal Medicine

## 2024-02-16 ENCOUNTER — Encounter: Payer: Self-pay | Admitting: Internal Medicine

## 2024-02-16 ENCOUNTER — Ambulatory Visit (INDEPENDENT_AMBULATORY_CARE_PROVIDER_SITE_OTHER): Admitting: Internal Medicine

## 2024-02-16 VITALS — BP 118/76 | HR 76 | Resp 18 | Ht 74.0 in | Wt 171.0 lb

## 2024-02-16 DIAGNOSIS — G8929 Other chronic pain: Secondary | ICD-10-CM

## 2024-02-16 DIAGNOSIS — Z0000 Encounter for general adult medical examination without abnormal findings: Secondary | ICD-10-CM

## 2024-02-16 DIAGNOSIS — I7300 Raynaud's syndrome without gangrene: Secondary | ICD-10-CM

## 2024-02-16 DIAGNOSIS — M546 Pain in thoracic spine: Secondary | ICD-10-CM | POA: Diagnosis not present

## 2024-02-16 DIAGNOSIS — Z1159 Encounter for screening for other viral diseases: Secondary | ICD-10-CM | POA: Diagnosis not present

## 2024-02-16 DIAGNOSIS — Z23 Encounter for immunization: Secondary | ICD-10-CM | POA: Diagnosis not present

## 2024-02-16 DIAGNOSIS — Z639 Problem related to primary support group, unspecified: Secondary | ICD-10-CM | POA: Diagnosis not present

## 2024-02-16 MED ORDER — AMLODIPINE BESYLATE 2.5 MG PO TABS: 2.5000 mg | ORAL_TABLET | Freq: Every day | ORAL | 11 refills | Status: AC

## 2024-02-16 NOTE — Telephone Encounter (Signed)
 Patient would like to be on wait list for an appointment for to remove glass pieces with bp check .  Patient is available after 4pm , any day. Patient is unable for same day cancellation, patient would like to get two day notice for appointment.

## 2024-02-16 NOTE — Progress Notes (Signed)
 Subjective:    Patient ID: Eric Townsend, male   DOB: [birth year], 33 y.o.   MRN: [id number]   HPI  Here for Male CPE:  1.  STE:  Does not really pay attention.  No family history of testicular cancer.    2.  PSA:  Never.  No family history of prostate cancer.    3.  Guaiac Cards/FIT:  Never  4.  Colonoscopy: Never.  No family history of colon cancer.    5.  Cholesterol/Glucose:  Cholesterol excellent in past 2023 and glucose fine.   Lipid Panel     Component Value Date/Time   CHOL 186 02/05/2022 1115   TRIG 75 02/05/2022 1115   HDL 101 02/05/2022 1115   LDLCALC 71 02/05/2022 1115   LABVLDL 14 02/05/2022 1115     6.  Immunizations:  Needs Influenza and COVID Immunization History  Administered Date(s) Administered   Dtap, Unspecified 02/24/1989, 05/02/1989, 07/02/1989, 05/21/1991, 01/11/1994   HIB, Unspecified 10/03/1989, 02/14/1990, 04/22/1990   Hep A, Unspecified 05/12/2006, 11/23/2006   Hep B, Unspecified 02/12/2000, 03/25/2000, 09/02/2000   Influenza Inj Mdck Quad With Preservative 05/30/2020   MMR 04/22/1990, 01/11/1994   PFIZER(Purple Top)SARS-COV-2 Vaccination 08/03/2019, 08/27/2019, 05/02/2020   Pfizer Covid-19 Vaccine Bivalent Booster 66yrs & up 03/05/2021   Polio, Unspecified 02/24/1989, 05/02/1989, 07/02/1989, 05/21/1991   Td (Adult),unspecified 12/18/2001, 09/22/2006   Tdap 12/05/2012, 08/20/2023     Current Meds  Medication Sig   lamoTRIgine  (LAMICTAL ) 100 MG tablet Take 1 tablet (100 mg total) by mouth daily.   traZODone  (DESYREL ) 100 MG tablet Take 1 tablet (100 mg total) by mouth at bedtime.   No Known Allergies  Past Medical History:  Diagnosis Date   Bipolar disorder (HCC)    Generalized anxiety disorder    Was seen at [facility] for this or [employer] student counseling services. Has been on mediction:  Zoloft 100 mg--doesn't like, Prozac 40 mg   Testicular torsion    Past Surgical History:  Procedure Laterality Date   SURGERY SCROTAL /  TESTICULAR Bilateral 05/2014   Removed left varicocele and performed bilateral orchiopexy   Family History  Problem Relation Age of Onset   Obesity Mother    Other Mother        Prediabetes   Kidney disease Father    Diabetes Father    Obesity Father    Hyperlipidemia Father    Depression Father        and Anxiety   Other Father 32       COVID   Diabetes Maternal Aunt    Diabetes Paternal Grandmother    Social History   Socioeconomic History   Marital status: Single    Spouse name: Psychologist, occupational   Number of children: 0   Years of education: college grad sociology   Highest education level: Not on file  Occupational History   Occupation: Archivist  Tobacco Use   Smoking status: Former    Current packs/day: 0.00    Average packs/day: 0.3 packs/day for 9.0 years (2.3 ttl pk-yrs)    Types: Cigarettes    Start date: 07/16/2009    Quit date: 07/16/2018    Years since quitting: 5.5    Passive exposure: Past   Smokeless tobacco: Never  Vaping Use   Vaping status: Former   Devices: very mild in past  Substance and Sexual Activity   Alcohol use: Yes    Comment: History of too much, but stopped completely after March when  cut hand on glass.   Drug use: Yes    Types: Marijuana    Comment: Edibles mainly, but can smoke.   Sexual activity: Yes    Birth control/protection: None  Other Topics Concern   Not on file  Social History Narrative   Lives with long term girlfriend, [name], who is a high Engineer, site at [employer] in [place].   Social Drivers of Corporate investment banker Strain: Low Risk  (02/16/2024)   Overall Financial Resource Strain (CARDIA)    Difficulty of Paying Living Expenses: Not very hard  Food Insecurity: No Food Insecurity (02/16/2024)   Hunger Vital Sign    Worried About Running Out of Food in the Last Year: Never true    Ran Out of Food in the Last Year: Never true  Transportation Needs: No Transportation Needs (02/16/2024)    PRAPARE - Administrator, Civil Service (Medical): No    Lack of Transportation (Non-Medical): No  Physical Activity: Not on file  Stress: Not on file  Social Connections: Unknown (10/06/2021)   Received from [facility]   Social Network    Social Network: Not on file  Intimate Partner Violence: Not At Risk (02/16/2024)   Humiliation, Afraid, Rape, and Kick questionnaire    Fear of Current or Ex-Partner: No    Emotionally Abused: No    Physically Abused: No    Sexually Abused: No        Review of Systems  HENT:  Negative for dental problem (Does have dental insurance.).   Eyes:  Negative for visual disturbance.  Respiratory:  Negative for shortness of breath.   Cardiovascular:  Negative for chest pain and leg swelling.       Raynauds of fingers and toes daily during colder weather--occurred during visit with fingers today.  Gastrointestinal:  Negative for blood in stool (No melena).      Objective:   BP 118/76 (BP Location: Left Arm, Patient Position: Sitting, Cuff Size: Normal)   Pulse 76   Resp 18   Ht 6' 2 (1.88 m)   Wt 171 lb (77.6 kg)   BMI 21.96 kg/m   Physical Exam Constitutional:      Appearance: Normal appearance.  HENT:     Head: Normocephalic and atraumatic.     Right Ear: Tympanic membrane, ear canal and external ear normal.     Left Ear: Tympanic membrane, ear canal and external ear normal.     Nose: Nose normal.     Mouth/Throat:     Mouth: Mucous membranes are moist.     Pharynx: Oropharynx is clear.  Eyes:     Extraocular Movements: Extraocular movements intact.     Conjunctiva/sclera: Conjunctivae normal.     Pupils: Pupils are equal, round, and reactive to light.     Comments: Discs sharp  Neck:     Thyroid: No thyroid mass or thyromegaly.  Cardiovascular:     Rate and Rhythm: Normal rate and regular rhythm.     Pulses: Normal pulses.     Heart sounds: S1 normal and S2 normal. No murmur heard.    No friction rub. No S3 or  S4 sounds.  Pulmonary:     Effort: Pulmonary effort is normal.     Breath sounds: Normal breath sounds and air entry.  Abdominal:     General: Abdomen is flat. Bowel sounds are normal.     Palpations: There is no hepatomegaly, splenomegaly or mass.  Tenderness: There is no abdominal tenderness.     Hernia: No hernia is present.  Genitourinary:    Penis: Normal and circumcised.      Testes:        Right: Mass or tenderness not present. Right testis is descended.        Left: Mass or tenderness not present. Left testis is descended.  Musculoskeletal:        General: Normal range of motion.     Cervical back: Normal range of motion and neck supple.     Right lower leg: No edema.     Left lower leg: No edema.  Lymphadenopathy:     Head:     Right side of head: No submental or submandibular adenopathy.     Left side of head: No submental or submandibular adenopathy.     Cervical: No cervical adenopathy.     Upper Body:     Right upper body: No supraclavicular adenopathy.     Left upper body: No supraclavicular adenopathy.     Lower Body: No right inguinal adenopathy. No left inguinal adenopathy.  Skin:    General: Skin is warm.     Capillary Refill: Capillary refill takes less than 2 seconds.     Findings: No rash.     Comments: Callused area of left hand hypothenar eminence and pad of middle finger with sensation of FB on palpation.  Neurological:     General: No focal deficit present.     Mental Status: He is alert and oriented to person, place, and time.     Cranial Nerves: Cranial nerves 2-12 are intact.     Sensory: Sensation is intact.     Motor: Motor function is intact.     Coordination: Coordination is intact.     Gait: Gait is intact.     Deep Tendon Reflexes: Reflexes are normal and symmetric.  Psychiatric:        Mood and Affect: Mood normal.        Speech: Speech normal.        Behavior: Behavior normal. Behavior is cooperative.      Assessment & Plan    CPE Hepatitis C screen Influenza vaccine Encouraged COVID vaccination.  2.  Raynauds Phenomenon:  Amlodipine  2.5 mg daily--will recheck bp when in to have glass removed from hand  3.  Glass foreign bodies in pad of left  middle digit and hypothenar eminence.  Return for removal as appear to be scarred in place now.  4.  Relationship difficulties with long term girlfriend:  referral for counseling.

## 2024-02-21 NOTE — Telephone Encounter (Signed)
 Patient has been schedule.

## 2024-03-20 ENCOUNTER — Telehealth (INDEPENDENT_AMBULATORY_CARE_PROVIDER_SITE_OTHER): Admitting: Physician Assistant

## 2024-03-20 ENCOUNTER — Encounter (HOSPITAL_COMMUNITY): Payer: Self-pay | Admitting: Physician Assistant

## 2024-03-20 DIAGNOSIS — F900 Attention-deficit hyperactivity disorder, predominantly inattentive type: Secondary | ICD-10-CM | POA: Diagnosis not present

## 2024-03-20 DIAGNOSIS — F3181 Bipolar II disorder: Secondary | ICD-10-CM | POA: Diagnosis not present

## 2024-03-20 MED ORDER — VILOXAZINE HCL ER 200 MG PO CP24: 200.0000 mg | ORAL_CAPSULE | Freq: Every day | ORAL | 1 refills | Status: DC

## 2024-03-20 MED ORDER — TRAZODONE HCL 100 MG PO TABS: 100.0000 mg | ORAL_TABLET | Freq: Every day | ORAL | 2 refills | Status: DC

## 2024-03-20 MED ORDER — LAMOTRIGINE 100 MG PO TABS: 100.0000 mg | ORAL_TABLET | Freq: Every day | ORAL | 2 refills | Status: DC

## 2024-03-20 NOTE — Progress Notes (Signed)
 [facility] MD/PA/NP OP Progress Note  Virtual Visit via Video Note  I connected with Eric Townsend on 03/20/24 at 11:30 AM EDT by a video enabled telemedicine application and verified that I am speaking with the correct person using two identifiers.  Location: Patient: Home Provider: Clinic   I discussed the limitations of evaluation and management by telemedicine and the availability of in person appointments. The patient expressed understanding and agreed to proceed.  Follow Up Instructions:  I discussed the assessment and treatment plan with the patient. The patient was provided an opportunity to ask questions and all were answered. The patient agreed with the plan and demonstrated an understanding of the instructions.   The patient was advised to call back or seek an in-person evaluation if the symptoms worsen or if the condition fails to improve as anticipated.  I provided 36 minutes of non-face-to-face time during this encounter.  Reginia FORBES Bolster, PA    03/20/2024 11:30 AM ERRIK MITCHELLE  MRN:  [id number]  Chief Complaint:  No chief complaint on file.  HPI:   Eric Townsend. 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  Patient is currently being managed on the following psychiatric medications:  Trazodone  100 mg at bedtime Lamictal  100 mg daily sleep  Patient presents to the encounter stating that he has been doing well and has been taking his medications as prescribed.  Prior to the discussion of his mental health, patient informed provider over concerns of being prescribed amlodipine /Norvasc .  Provider informed patient that amlodipine  is not normally a medication that would be prescribed by this provider.  Provider was able  to determine that patient's primary care provider/family medicine provider is currently prescribing the medication for him.  Provider instructed patient to reach out to provider to discuss his use of amlodipine /Norvasc .  Patient reports that he is interested in being prescribed Qelbree for the management of his attention deficit hyperactivity disorder.  He reports that his past use of the medication was effective in managing his symptoms.  Patient confirms that he currently has insurance and is interested in seeing if his insurance covers the use of Qelbree.  Patient reports that his use of trazodone  and Lamictal  has been going well.  He reports that his use of trazodone  has been helpful in allowing him to stay asleep.  Patient expressed interest in determining how long he will need to remain on trazodone  for the management of his sleep and mood.  Patient continues to endorse ongoing depression but states that his symptoms have not been as impactful as they have been in the past.  He reports that his depression is attributed to working and general life stressors.  He endorses having long work hours that prevent him from being able to hang out with his friends.  Patient reports that he would like to be better at time management and work life balance.  Patient also endorses anxiety and rates his anxiety a 6 out of 10.  He reports that his anxiety is never below a 3 and attributes his anxiety due to the change in time and not being able to interact with his friend group.  A PHQ-9 screen was performed with the patient scoring a 12.  A GAD-7 screen was also performed with the patient scoring a 10.  Patient is alert and oriented x 4, calm, cooperative, and fully engaged in conversation during the encounter.  Patient endorses pretty good mood.  Patient exhibits depressed mood with appropriate affect.  Patient endorses having suicidal thoughts but denies having a specific plan or intent.  Patient denies homicidal  ideations.  He further denies auditory or visual hallucinations and does not appear to be responding to internal/external stimuli.  Patient endorses fair sleep and receives on average 5 to 6 hours of sleep per night.  Patient endorses good appetite and eats on average 2-3 meals per day.  Patient denies alcohol consumption or tobacco use.  Patient endorses illicit drug use in the form of THC which he states that he buys legally from the stores.  Visit Diagnosis:    ICD-10-CM   1. Attention deficit hyperactivity disorder (ADHD), predominantly inattentive type  F90.0 viloxazine ER (QELBREE) 200 MG 24 hr capsule    2. Bipolar II disorder (HCC)  F31.81 traZODone  (DESYREL ) 100 MG tablet    lamoTRIgine  (LAMICTAL ) 100 MG tablet      Past Psychiatric History:  Anxiety Depression Attention deficit hyperactivity disorder Bipolar 2 disorder  Past Medical History:  Past Medical History:  Diagnosis Date   Bipolar disorder (HCC)    Generalized anxiety disorder    Was seen at [facility] for this or [employer] student counseling services. Has been on mediction:  Zoloft 100 mg--doesn't like, Prozac 40 mg   Testicular torsion     Past Surgical History:  Procedure Laterality Date   SURGERY SCROTAL / TESTICULAR Bilateral 05/2014   Removed left varicocele and performed bilateral orchiopexy    Family Psychiatric History:  Father - anxiety and depression Mother - anxiety  Family History:  Family History  Problem Relation Age of Onset   Obesity Mother    Other Mother        Prediabetes   Kidney disease Father    Diabetes Father    Obesity Father    Hyperlipidemia Father    Depression Father        and Anxiety   Other Father 78       COVID   Diabetes Maternal Aunt    Diabetes Paternal Grandmother     Social History:  Social History   Socioeconomic History   Marital status: Single    Spouse name: Psychologist, Occupational   Number of children: 0   Years of education: college grad sociology   Highest education  level: Not on file  Occupational History   Occupation: Archivist  Tobacco Use   Smoking status: Former    Current packs/day: 0.00    Average packs/day: 0.3 packs/day for 9.0 years (2.3 ttl pk-yrs)    Types: Cigarettes    Start date: 07/16/2009    Quit date: 07/16/2018    Years since quitting: 5.6    Passive exposure: Past   Smokeless tobacco: Never  Vaping Use   Vaping status: Former   Devices: very mild in past  Substance and Sexual Activity   Alcohol use: Yes    Comment: History of too much, but stopped completely after March when cut hand on glass.   Drug use: Yes    Types: Marijuana    Comment: Edibles mainly, but can smoke.   Sexual activity: Yes    Birth control/protection: None  Other Topics Concern   Not on file  Social History Narrative   Lives with long term girlfriend, [name], who is a high engineer, site at [employer] in  [place].   Social Drivers of Corporate Investment Banker Strain: Low Risk  (02/16/2024)   Overall Financial Resource Strain (CARDIA)    Difficulty of Paying Living Expenses: Not very hard  Food Insecurity: No Food Insecurity (02/16/2024)   Hunger Vital Sign    Worried About Running Out of Food in the Last Year: Never true    Ran Out of Food in the Last Year: Never true  Transportation Needs: No Transportation Needs (02/16/2024)   PRAPARE - Administrator, Civil Service (Medical): No    Lack of Transportation (Non-Medical): No  Physical Activity: Not on file  Stress: Not on file  Social Connections: Unknown (10/06/2021)   Received from [facility]   Social Network    Social Network: Not on file    Allergies: No Known Allergies  Metabolic Disorder Labs: No results found for: HGBA1C, MPG No results found for: PROLACTIN Lab Results  Component Value Date   CHOL 186 02/05/2022   TRIG 75 02/05/2022   HDL 101 02/05/2022   LDLCALC 71 02/05/2022   LDLCALC 107 (H) 05/30/2020   No results found for:  TSH  Therapeutic Level Labs: No results found for: LITHIUM No results found for: VALPROATE No results found for: CBMZ  Current Medications: Current Outpatient Medications  Medication Sig Dispense Refill   amLODipine  (NORVASC ) 2.5 MG tablet Take 1 tablet (2.5 mg total) by mouth daily. 30 tablet 11   lamoTRIgine  (LAMICTAL ) 100 MG tablet Take 1 tablet (100 mg total) by mouth daily. 30 tablet 2   traZODone  (DESYREL ) 100 MG tablet Take 1 tablet (100 mg total) by mouth at bedtime. 30 tablet 2   viloxazine ER (QELBREE) 200 MG 24 hr capsule Take 1 capsule (200 mg total) by mouth daily. 30 capsule 1   No current facility-administered medications for this visit.     Musculoskeletal: Strength & Muscle Tone: within normal limits Gait & Station: normal Patient leans: N/A  Psychiatric Specialty Exam: Review of Systems  Psychiatric/Behavioral:  Positive for decreased concentration, dysphoric mood and sleep disturbance. Negative for hallucinations, self-injury and suicidal ideas. The patient is nervous/anxious. The patient is not hyperactive.     There were no vitals taken for this visit.There is no height or weight on file to calculate BMI.  General Appearance: Casual  Eye Contact:  Good  Speech:  Clear and Coherent and Normal Rate  Volume:  Normal  Mood:  Anxious and Depressed  Affect:  Appropriate  Thought Process:  Coherent, Goal Directed, and Descriptions of Associations: Intact  Orientation:  Full (Time, Place, and Person)  Thought Content: WDL   Suicidal Thoughts:  Yes.  without intent/plan  Homicidal Thoughts:  No  Memory:  Immediate;   Good Recent;   Good Remote;   Good  Judgement:  Good  Insight:  Good  Psychomotor Activity:  Normal  Concentration:  Concentration: Good and Attention Span: Good  Recall:  Good  Fund of Knowledge: Good  Language: Good  Akathisia:  No  Handed:  Right  AIMS (if indicated): not done  Assets:  Communication Skills Desire for  Improvement Housing Social Support Vocational/Educational  ADL's:  Intact  Cognition: WNL  Sleep:  Fair   Screenings: GAD-7    Flowsheet Row Video Visit from 03/20/2024 in [facility] Video Visit from 01/27/2024 in [facility] Video Visit from 12/15/2023 in [facility] Video Visit from 07/21/2023 in [facility]  Video Visit from 03/23/2023 in [facility]  Total GAD-7 Score 10 10 10 15 10    PHQ2-9    Flowsheet Row Video Visit from 03/20/2024 in [facility] Video Visit from 01/27/2024 in [facility] Video Visit from 12/15/2023 in [facility] Video Visit from 07/21/2023 in [facility] Video Visit from 03/23/2023 in [facility]  PHQ-2 Total Score 3 3 3 3 3   PHQ-9 Total Score 12 12 13 13 13    Flowsheet Row Video Visit from 03/20/2024 in [facility] Video Visit from 01/27/2024 in [facility] Video Visit from 12/15/2023 in [facility]  C-SSRS RISK CATEGORY Moderate Risk Moderate Risk Moderate Risk     Assessment and Plan:   Cleatus Gabriel. 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  Patient presents to the encounter stating that he has been taking his medications regularly and denies experiencing any adverse side effects associated with the use of his Lamictal  or trazodone .  Patient continues to endorse some depression and anxiety which has been directly impacted by his current work  schedule.  A PHQ-9 screen was performed with the patient scoring a 12.  A GAD-7 screen was also performed with the patient scoring a 10.  Despite the impact of his work has on his mental health, patient reports that his use of his current medication regimen continues to be helpful in maintaining stability.  Patient would like to continue taking his trazodone  and Lamictal  as prescribed.  Patient's medications to be e-prescribed to pharmacy of choice.  Patient is also interested in being placed back on Qelbree.  Patient has a past history of being given a 30-day supply of the medication and states that it was helpful in managing his symptoms of attention deficit hyperactivity disorder.  Patient confirmed with provider that he currently has insurance.  Provider to prescribe Qelbree 200 mg daily for the management of his ADHD.  Patient's medication to be sent to preferred pharmacy.  A [place] Suicide Severity Rating Scale was performed with the patient being considered moderate risk.  Patient endorses passive suicidal ideations with no intent or plan.  Safety planning was discussed with the patient prior to the conclusion of the encounter.  - Patient was instructed to contact 911 in the event of a mental health crisis. - Patient was instructed to contact [facility] Su[facility] Crisis [facility]eline in the event of a mental health crisis. - Patient was instructed to present to [facility] in the event of a mental health crisis.  Collaboration of Care: Collaboration of Care: Medication Management AEB provider managing patient's psychiatric medications and Psychiatrist AEB patient being followed by a mental health provider  Patient/Guardian was advised Release of Information must be obtained prior to any record release in order to collaborate their care with an outside provider. Patient/Guardian was advised if they have not already done so to contact the registration department to sign all  necessary forms in order for us  to release information regarding their care.   Consent: Patient/Guardian gives verbal consent for treatment and assignment of benefits for services provided during this visit. Patient/Guardian expressed understanding and agreed to proceed.  1. Attention deficit hyperactivity disorder (ADHD), predominantly inattentive type  - viloxazine ER (QELBREE) 200 MG 24 hr capsule; Take 1 capsule (200 mg total) by mouth daily.  Dispense: 30 capsule; Refill: 1  2. Bipolar II disorder (HCC)  - traZODone  (DESYREL ) 100 MG tablet; Take 1 tablet (100 mg total) by mouth at bedtime.  Dispense: 30 tablet; Refill: 2 - lamoTRIgine  (LAMICTAL ) 100 MG tablet; Take 1 tablet (100 mg total) by mouth daily.  Dispense: 30 tablet; Refill: 2  Patient to follow up in 6 weeks Provider spent a total of 30 minutes with the patient/reviewing the patient's chart  Reginia FORBES Bolster, PA 03/20/2024, 11:30 AM

## 2024-05-01 ENCOUNTER — Encounter (HOSPITAL_COMMUNITY): Payer: Self-pay

## 2024-05-01 ENCOUNTER — Telehealth (HOSPITAL_COMMUNITY): Admitting: Physician Assistant

## 2024-05-08 ENCOUNTER — Encounter (HOSPITAL_COMMUNITY): Payer: Self-pay | Admitting: Physician Assistant

## 2024-05-08 ENCOUNTER — Telehealth (INDEPENDENT_AMBULATORY_CARE_PROVIDER_SITE_OTHER): Admitting: Physician Assistant

## 2024-05-08 DIAGNOSIS — F900 Attention-deficit hyperactivity disorder, predominantly inattentive type: Secondary | ICD-10-CM

## 2024-05-08 DIAGNOSIS — F3181 Bipolar II disorder: Secondary | ICD-10-CM

## 2024-05-08 MED ORDER — LAMOTRIGINE 100 MG PO TABS: 100.0000 mg | ORAL_TABLET | Freq: Every day | ORAL | 2 refills | Status: AC

## 2024-05-08 MED ORDER — VILOXAZINE HCL ER 200 MG PO CP24: 200.0000 mg | ORAL_CAPSULE | Freq: Every day | ORAL | 1 refills | Status: AC

## 2024-05-08 MED ORDER — TRAZODONE HCL 100 MG PO TABS: 100.0000 mg | ORAL_TABLET | Freq: Every day | ORAL | 2 refills | Status: AC

## 2024-05-08 NOTE — Progress Notes (Signed)
 BH MD/PA/NP OP Progress Note  Virtual Visit via Video Note  I connected with Eric Townsend on 05/08/24 at  5:00 PM EST by a video enabled telemedicine application and verified that I am speaking with the correct person using two identifiers.  Location: Patient: Home Provider: Clinic   I discussed the limitations of evaluation and management by telemedicine and the availability of in person appointments. The patient expressed understanding and agreed to proceed.  Follow Up Instructions:  I discussed the assessment and treatment plan with the patient. The patient was provided an opportunity to ask questions and all were answered. The patient agreed with the plan and demonstrated an understanding of the instructions.   The patient was advised to call back or seek an in-person evaluation if the symptoms worsen or if the condition fails to improve as anticipated.  I provided 33 minutes of non-face-to-face time during this encounter.  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  Patient is currently being managed on the following psychiatric medications:  Viloxazine ER (Qelbree) 200 mg 24-hour capsule daily Trazodone  100 mg at bedtime Lamictal  100 mg daily sleep  Patient presents to the encounter stating that he has been taking his medications regularly, but states that he has still not received his Qelbree prescription.  Patient is requesting that his Qelbree prescription be sent to his pharmacy following the conclusion of the  encounter.  Patient reports that his car recently broke down due to issues with the transmission.  Patient also states that he has been struggling with sickness and intestinal issues.  He continues to endorse depression attributed to the holidays.  Patient rates his depression a 7-8 out of 10 with 10 being most severe.  Patient endorses depressive episodes daily and states that his symptoms usually happen after work.  Patient endorses the following depressive symptoms: feelings of sadness, lack of motivation (difficulty getting out of bed/difficulty doing certain things at home), decreased concentration, decreased energy (due to not sleeping/drinking enough water), irritability, feelings of guilt/worthlessness, and hopelessness (improving).  Patient also endorses anxiety and rates his anxiety an 8 out of 10.  He reports that his anxiety is attributed to work related stressors, his car issues, relationship issues, and concerns for his health.  A PHQ-9 screen was performed with the patient scoring a 14.  A GAD-7 screen was also performed with the patient scoring a 13.  Patient is alert and oriented x 4, calm, cooperative, and fully engaged in conversation during the encounter.  Patient describes his mood as stressed.  Patient exhibits depressed mood with congruent affect.  Patient endorses thoughts of wanting to harm himself but denies having a specific plan.  Patient denies homicidal ideations.  He further denies auditory or visual hallucinations and does not appear to be responding to internal/external stimuli.  Patient endorses fair sleep and receives on average 5 to 6 hours of sleep per night.  Patient endorses good appetite and eats on average 2-3 meals per day.  Patient denies alcohol consumption or tobacco use.  Patient endorses illicit drug use  in the form of marijuana (edibles).  Visit Diagnosis:    ICD-10-CM   1. Attention deficit hyperactivity disorder (ADHD), predominantly inattentive type  F90.0  viloxazine ER (QELBREE) 200 MG 24 hr capsule    2. Bipolar II disorder (HCC)  F31.81 traZODone  (DESYREL ) 100 MG tablet    lamoTRIgine  (LAMICTAL ) 100 MG tablet      Past Psychiatric History:  Anxiety Depression Attention deficit hyperactivity disorder Bipolar 2 disorder  Past Medical History:  Past Medical History:  Diagnosis Date   Bipolar disorder (HCC)    Generalized anxiety disorder    Was seen at [facility] for this or [employer] student counseling services. Has been on mediction:  Zoloft 100 mg--doesn't like, Prozac 40 mg   Testicular torsion     Past Surgical History:  Procedure Laterality Date   SURGERY SCROTAL / TESTICULAR Bilateral 05/2014   Removed left varicocele and performed bilateral orchiopexy    Family Psychiatric History:  Father - anxiety and depression Mother - anxiety  Family History:  Family History  Problem Relation Age of Onset   Obesity Mother    Other Mother        Prediabetes   Kidney disease Father    Diabetes Father    Obesity Father    Hyperlipidemia Father    Depression Father        and Anxiety   Other Father 34       COVID   Diabetes Maternal Aunt    Diabetes Paternal Grandmother     Social History:  Social History   Socioeconomic History   Marital status: Single    Spouse name: Psychologist, Occupational   Number of children: 0   Years of education: college grad sociology   Highest education level: Not on file  Occupational History   Occupation: Archivist  Tobacco Use   Smoking status: Former    Current packs/day: 0.00    Average packs/day: 0.3 packs/day for 9.0 years (2.3 ttl pk-yrs)    Types: Cigarettes    Start date: 07/16/2009    Quit date: 07/16/2018    Years since quitting: 5.8    Passive exposure: Past   Smokeless tobacco: Never  Vaping Use   Vaping status: Former   Devices: very mild in past  Substance and Sexual Activity   Alcohol use: Yes    Comment: History of too much, but stopped completely after March  when cut hand on glass.   Drug use: Yes    Types: Marijuana    Comment: Edibles mainly, but can smoke.   Sexual activity: Yes    Birth control/protection: None  Other Topics Concern   Not on file  Social History Narrative   Lives with long term girlfriend, [name], who is a high engineer, site at [employer] in [place].   Social Drivers of Health   Tobacco Use: Medium Risk (05/08/2024)   Patient History    Smoking Tobacco Use: Former    Smokeless Tobacco Use: Never    Passive Exposure: Past  Physicist, Medical Strain: Low Risk (02/16/2024)   Overall Financial Resource Strain (CARDIA)    Difficulty of Paying Living Expenses: Not very hard  Food Insecurity: No Food Insecurity (02/16/2024)   Epic    Worried About Programme Researcher, Broadcasting/film/video in the Last Year: Never true    Ran Out of Food in the Last Year: Never true  Transportation Needs: No Transportation Needs (02/16/2024)   Epic    Lack of Transportation (Medical): No  Lack of Transportation (Non-Medical): No  Physical Activity: Not on file  Stress: Not on file  Social Connections: Unknown (10/06/2021)   Received from [facility]   Social Network    Social Network: Not on file  Depression (PHQ2-9): High Risk (05/08/2024)   Depression (PHQ2-9)    PHQ-2 Score: 14  Alcohol Screen: Not on file  Housing: Unknown (02/16/2024)   Epic    Unable to Pay for Housing in the Last Year: No    Number of Times Moved in the Last Year: Not on file    Homeless in the Last Year: No  Utilities: Not At Risk (02/16/2024)   Epic    Threatened with loss of utilities: No  Health Literacy: Not on file    Allergies: No Known Allergies  Metabolic Disorder Labs: No results found for: HGBA1C, MPG No results found for: PROLACTIN Lab Results  Component Value Date   CHOL 186 02/05/2022   TRIG 75 02/05/2022   HDL 101 02/05/2022   LDLCALC 71 02/05/2022   LDLCALC 107 (H) 05/30/2020   No results found for: TSH  Therapeutic Level Labs: No  results found for: LITHIUM No results found for: VALPROATE No results found for: CBMZ  Current Medications: Current Outpatient Medications  Medication Sig Dispense Refill   amLODipine  (NORVASC ) 2.5 MG tablet Take 1 tablet (2.5 mg total) by mouth daily. 30 tablet 11   lamoTRIgine  (LAMICTAL ) 100 MG tablet Take 1 tablet (100 mg total) by mouth daily. 30 tablet 2   traZODone  (DESYREL ) 100 MG tablet Take 1 tablet (100 mg total) by mouth at bedtime. 30 tablet 2   viloxazine ER (QELBREE) 200 MG 24 hr capsule Take 1 capsule (200 mg total) by mouth daily. 30 capsule 1   No current facility-administered medications for this visit.     Musculoskeletal: Strength & Muscle Tone: within normal limits Gait & Station: normal Patient leans: N/A  Psychiatric Specialty Exam: Review of Systems  Psychiatric/Behavioral:  Positive for decreased concentration, dysphoric mood and sleep disturbance. Negative for hallucinations, self-injury and suicidal ideas. The patient is nervous/anxious. The patient is not hyperactive.     There were no vitals taken for this visit.There is no height or weight on file to calculate BMI.  General Appearance: Casual  Eye Contact:  Good  Speech:  Clear and Coherent and Normal Rate  Volume:  Normal  Mood:  Anxious and Depressed  Affect:  Appropriate  Thought Process:  Coherent, Goal Directed, and Descriptions of Associations: Intact  Orientation:  Full (Time, Place, and Person)  Thought Content: WDL   Suicidal Thoughts:  Yes.  without intent/plan  Homicidal Thoughts:  No  Memory:  Immediate;   Good Recent;   Good Remote;   Good  Judgement:  Good  Insight:  Good  Psychomotor Activity:  Normal  Concentration:  Concentration: Good and Attention Span: Good  Recall:  Good  Fund of Knowledge: Good  Language: Good  Akathisia:  No  Handed:  Right  AIMS (if indicated): not done  Assets:  Communication Skills Desire for Improvement Housing Social  Support Vocational/Educational  ADL's:  Intact  Cognition: WNL  Sleep:  Fair   Screenings: GAD-7    Flowsheet Row Video Visit from 05/08/2024 in [facility] Video Visit from 03/20/2024 in [facility] Video Visit from 01/27/2024 in [facility] Video Visit from 12/15/2023 in [facility] Video Visit from 07/21/2023 in [facility]  Total GAD-7 Score 13 10 10 10 15    PHQ2-9    Flowsheet Row Video Visit from 05/08/2024 in [facility] Video Visit from 03/20/2024 in [facility] Video Visit from 01/27/2024 in [facility] Video Visit from 12/15/2023 in [facility] Video Visit from 07/21/2023 in [facility]  PHQ-2 Total Score 3 3 3 3 3   PHQ-9 Total Score 14 12 12 13 13    Flowsheet Row Video Visit from 05/08/2024 in [facility] Video Visit from 03/20/2024 in [facility] Video Visit from 01/27/2024 in [facility]  C-SSRS RISK CATEGORY Moderate Risk Moderate Risk Moderate Risk     Assessment and Plan:   Eric Townsend is a 33 year old male with a past psychiatric history significant for bipolar 2 disorder and 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  Patient presents to the encounter stating that he has been taking his medications regularly; however, he reports that he still has not received his Qelbree prescription from his pharmacy.  Patient is requesting that his Qelbree prescription be sent to his pharmacy following the conclusion of the encounter.  Patient continues to  endorse depression and anxiety attributed to several stressors in his life.  A PHQ-9 screen was performed with the patient scoring a 14.  A GAD-7 screen was also performed with the patient scoring a 13.  Despite his ongoing depressive symptoms and anxiety, patient endorses stability on his current medication regimen.  Patient denies the need for dosage adjustments at this time and would like to continue taking his medications as prescribed.  Patient's medications to be e-prescribed to pharmacy of choice.  A [place] Suicide Severity Rating Scale was performed with the patient being considered moderate risk.  Patient endorses passive suicidal ideations with no intent or plan.  Safety planning was discussed with the patient prior to the conclusion of the encounter.  - Patient was instructed to contact 911 in the event of a mental health crisis. - Patient was instructed to contact 988 Suicide and Crisis Lifeline in the event of a mental health crisis. - Patient was instructed to present to [facility] in the event of a mental health crisis.  Collaboration of Care: Collaboration of Care: Medication Management AEB provider managing patient's psychiatric medications and Psychiatrist AEB patient being followed by a mental health provider  Patient/Guardian was advised Release of Information must be obtained prior to any record release in order to collaborate their care with an outside provider. Patient/Guardian was advised if they have not already done so to contact the registration department to sign all necessary forms in order for us  to release information regarding their care.   Consent: Patient/Guardian gives verbal consent for treatment and assignment of benefits for services provided during this visit. Patient/Guardian expressed understanding and agreed to proceed.   1. Attention deficit hyperactivity disorder (ADHD), predominantly inattentive type  - viloxazine ER  (QELBREE) 200 MG 24 hr capsule; Take 1 capsule (200 mg total) by mouth daily.  Dispense: 30 capsule; Refill: 1  2. Bipolar II disorder (HCC)  - traZODone  (DESYREL ) 100 MG tablet; Take 1 tablet (100 mg total) by mouth at bedtime.  Dispense: 30 tablet; Refill: 2 - lamoTRIgine  (LAMICTAL ) 100 MG tablet; Take 1 tablet (100  mg total) by mouth daily.  Dispense: 30 tablet; Refill: 2  Patient to follow up in 2 months Provider spent a total of 33 minutes with the patient/reviewing the patient's chart  Eric FORBES Bolster, PA 05/08/2024, 5:00 PM

## 2024-06-06 ENCOUNTER — Telehealth: Payer: Self-pay | Admitting: Psychology

## 2024-06-14 ENCOUNTER — Emergency Department (HOSPITAL_COMMUNITY)
Admission: EM | Admit: 2024-06-14 | Discharge: 2024-06-14 | Disposition: A | Discharge status: Home / Self Care | Payer: Worker's Compensation | Attending: Emergency Medicine | Admitting: Emergency Medicine

## 2024-06-14 ENCOUNTER — Emergency Department (HOSPITAL_COMMUNITY): Payer: Worker's Compensation

## 2024-06-14 ENCOUNTER — Other Ambulatory Visit: Payer: Self-pay

## 2024-06-14 DIAGNOSIS — M545 Low back pain, unspecified: Secondary | ICD-10-CM | POA: Diagnosis not present

## 2024-06-14 DIAGNOSIS — S62002A Unspecified fracture of navicular [scaphoid] bone of left wrist, initial encounter for closed fracture: Secondary | ICD-10-CM | POA: Insufficient documentation

## 2024-06-14 DIAGNOSIS — M25531 Pain in right wrist: Secondary | ICD-10-CM | POA: Diagnosis not present

## 2024-06-14 DIAGNOSIS — R0789 Other chest pain: Secondary | ICD-10-CM | POA: Insufficient documentation

## 2024-06-14 DIAGNOSIS — Z79899 Other long term (current) drug therapy: Secondary | ICD-10-CM | POA: Diagnosis not present

## 2024-06-14 DIAGNOSIS — Y990 Civilian activity done for income or pay: Secondary | ICD-10-CM | POA: Diagnosis not present

## 2024-06-14 DIAGNOSIS — Y92410 Unspecified street and highway as the place of occurrence of the external cause: Secondary | ICD-10-CM | POA: Diagnosis not present

## 2024-06-14 DIAGNOSIS — S6992XA Unspecified injury of left wrist, hand and finger(s), initial encounter: Secondary | ICD-10-CM | POA: Diagnosis present

## 2024-06-14 MED ORDER — ACETAMINOPHEN 500 MG PO TABS
1000.0000 mg | ORAL_TABLET | Freq: Once | ORAL | Status: AC
  Administered 2024-06-14: 1000 mg via ORAL
  Filled 2024-06-14: qty 2

## 2024-06-14 MED ORDER — OXYCODONE HCL 5 MG PO TABS
5.0000 mg | ORAL_TABLET | Freq: Once | ORAL | Status: AC
  Administered 2024-06-14: 5 mg via ORAL
  Filled 2024-06-14: qty 1

## 2024-06-14 MED ORDER — CYCLOBENZAPRINE HCL 10 MG PO TABS: 10.0000 mg | ORAL_TABLET | Freq: Two times a day (BID) | ORAL | 0 refills | Status: AC | PRN

## 2024-06-14 MED ORDER — LIDOCAINE 5 % EX PTCH
1.0000 | MEDICATED_PATCH | Freq: Once | CUTANEOUS | Status: DC
  Administered 2024-06-14: 1 via TRANSDERMAL
  Filled 2024-06-14: qty 1

## 2024-06-14 MED ORDER — KETOROLAC TROMETHAMINE 30 MG/ML IJ SOLN
30.0000 mg | Freq: Once | INTRAMUSCULAR | Status: AC
  Administered 2024-06-14: 30 mg via INTRAMUSCULAR
  Filled 2024-06-14: qty 1

## 2024-06-14 MED ORDER — CYCLOBENZAPRINE HCL 10 MG PO TABS
5.0000 mg | ORAL_TABLET | Freq: Once | ORAL | Status: AC
  Administered 2024-06-14: 5 mg via ORAL
  Filled 2024-06-14: qty 1
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
## 2024-06-14 NOTE — ED Notes (Signed)
 Pt ambulatory to room, no assistance from staff.
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
## 2024-06-14 NOTE — Progress Notes (Signed)
 Orthopedic Tech Progress Note Patient Details:  Eric Townsend Jun 07, 1990 [id number]  Ortho Devices Type of Ortho Device: Velcro wrist splint Ortho Device/Splint Location: L THUMB/WRIST Ortho Device/Splint Interventions: Ordered, Application   Post Interventions Patient Tolerated: Well Instructions Provided: Care of device  Eric Townsend [name] 06/14/2024, 8:52 PM
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
## 2024-07-10 ENCOUNTER — Telehealth (HOSPITAL_COMMUNITY): Payer: Self-pay | Admitting: Physician Assistant

## 2025-02-15 ENCOUNTER — Other Ambulatory Visit: Admitting: Internal Medicine

## 2025-02-20 ENCOUNTER — Encounter: Admitting: Internal Medicine
